# Patient Record
Sex: Female | Born: 1940 | Race: White | Hispanic: No | Marital: Married | Smoking: Current every day smoker
Health system: Southern US, Community
[De-identification: ages and names within clinical notes are randomized; demographics above are authoritative.]

## PROBLEM LIST (undated history)

## (undated) DIAGNOSIS — J45909 Unspecified asthma, uncomplicated: Secondary | ICD-10-CM

## (undated) DIAGNOSIS — J309 Allergic rhinitis, unspecified: Secondary | ICD-10-CM

## (undated) HISTORY — PX: CHOLECYSTECTOMY: SHX55

## (undated) HISTORY — DX: Unspecified asthma, uncomplicated: J45.909

## (undated) HISTORY — DX: Allergic rhinitis, unspecified: J30.9

---

## 1999-06-11 ENCOUNTER — Other Ambulatory Visit: Admission: RE | Admit: 1999-06-11 | Discharge: 1999-06-11 | Payer: Self-pay | Admitting: Internal Medicine

## 1999-10-22 ENCOUNTER — Encounter: Payer: Self-pay | Admitting: Internal Medicine

## 1999-10-22 ENCOUNTER — Encounter: Admission: RE | Admit: 1999-10-22 | Discharge: 1999-10-22 | Payer: Self-pay | Admitting: Internal Medicine

## 2000-11-03 ENCOUNTER — Encounter: Admission: RE | Admit: 2000-11-03 | Discharge: 2000-11-03 | Payer: Self-pay | Admitting: Internal Medicine

## 2000-11-03 ENCOUNTER — Encounter: Payer: Self-pay | Admitting: Internal Medicine

## 2001-05-18 ENCOUNTER — Encounter: Admission: RE | Admit: 2001-05-18 | Discharge: 2001-05-18 | Payer: Self-pay | Admitting: Urology

## 2001-05-18 ENCOUNTER — Encounter: Payer: Self-pay | Admitting: Urology

## 2003-01-24 ENCOUNTER — Encounter: Admission: RE | Admit: 2003-01-24 | Discharge: 2003-01-24 | Payer: Self-pay | Admitting: Internal Medicine

## 2003-01-24 ENCOUNTER — Encounter: Payer: Self-pay | Admitting: Internal Medicine

## 2003-05-10 ENCOUNTER — Ambulatory Visit (HOSPITAL_BASED_OUTPATIENT_CLINIC_OR_DEPARTMENT_OTHER): Admission: RE | Admit: 2003-05-10 | Discharge: 2003-05-10 | Payer: Self-pay | Admitting: Orthopedic Surgery

## 2004-12-31 ENCOUNTER — Encounter: Admission: RE | Admit: 2004-12-31 | Discharge: 2004-12-31 | Payer: Self-pay | Admitting: Internal Medicine

## 2006-04-28 ENCOUNTER — Encounter: Admission: RE | Admit: 2006-04-28 | Discharge: 2006-04-28 | Payer: Self-pay | Admitting: Internal Medicine

## 2008-03-05 ENCOUNTER — Encounter: Admission: RE | Admit: 2008-03-05 | Discharge: 2008-03-05 | Payer: Self-pay | Admitting: Internal Medicine

## 2009-06-19 ENCOUNTER — Encounter: Admission: RE | Admit: 2009-06-19 | Discharge: 2009-06-19 | Payer: Self-pay | Admitting: Family Medicine

## 2009-07-10 ENCOUNTER — Encounter: Admission: RE | Admit: 2009-07-10 | Discharge: 2009-07-10 | Payer: Self-pay | Admitting: Otolaryngology

## 2010-07-14 ENCOUNTER — Encounter: Admission: RE | Admit: 2010-07-14 | Discharge: 2010-07-14 | Payer: Self-pay | Admitting: Internal Medicine

## 2010-07-25 ENCOUNTER — Encounter: Admission: RE | Admit: 2010-07-25 | Discharge: 2010-07-25 | Payer: Self-pay | Admitting: Internal Medicine

## 2011-05-08 NOTE — Op Note (Signed)
NAME:  Amber Holt, REMILLARD                        ACCOUNT NO.:  [id number]   MEDICAL RECORD NO.:  [id number]                   PATIENT TYPE:  AMB   LOCATION:  [facility]SC                                  FACILITY:  [facility]   PHYSICIAN:  Katy Fitch. Naaman Plummer., M.D.          DATE OF BIRTH:  Nov 15, 1941   DATE OF PROCEDURE:  05/10/2003  DATE OF DISCHARGE:                                 OPERATIVE REPORT   PREOPERATIVE DIAGNOSIS:  1. 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  2. Left thumb carpometacarpal joint arthrosis.   POSTOPERATIVE DIAGNOSIS:  1. 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  2. Left thumb carpometacarpal joint arthrosis.   OPERATION PERFORMED:  1. Release of left thumb A-1 pulley.  2. Injection of left thumb carpometacarpal joint with Depo-Medrol and     lidocaine.   SURGEON:  Katy Fitch. Sypher, M.D.   ASSISTANT:  Jonni Sanger, P.A.   ANESTHESIA:  0.25% Marcaine and 2% lidocaine field block overlying the A-1  pulley, left thumb supplemented by IV sedation.   SUPERVISING ANESTHESIOLOGIST:  Janetta Hora. Gelene Mink, M.D.   INDICATIONS FOR PROCEDURE:  The patient is a 70 year old woman who is self-  referred for evaluation of a painful left thumb.  She had chronic stenosing  tenosynovitis of her flexor pollicis longus with locking at the A-1 pulley.  She observed this at home for more than six months in that she had  previously experienced carpal tunnel syndrome and trigger finger on the  right.   She presented for evaluation and management requesting released of the A-1  pulley.  In addition she was noted to have pain at the base of her left  thumb consistent with carpometacarpal arthrosis.  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 After informed consent she requested injection of  the [facility] joint with Depo-Medrol and lidocaine in an effort to relieve her  joint inflammation and discomfort.   DESCRIPTION OF PROCEDURE:  Willard Madrigal was brought to the operating room  and placed in supine position on the operating table.  Following sedation,  the left arm was prepped with Betadine soap and solution and sterilely  draped.  0.25% Marcaine and 2% lidocaine were infiltrated in the metacarpal  head level and into the flexor sheath to obtain a field block anticipating  the A-1 pulley release.  When anesthesia was satisfactory, the arm was  exsanguinated with an Esmarch bandage.  An arterial tourniquet was inflated  to 220 mmHg.  The procedure commenced with a short transverse incision  directly over the palpably thickened A-1 pulley.  Subcutaneous tissues were  carefully divided taking care to gently retract the radial proper digital  nerve.  The A-1 pulley was isolated and split with scalpel and scissors.  The flexor pollicis longus was delivered and found to have areas of minor  pressure necrosis.  These were debrided with a rongeur.   Attention  was then directed to the [facility] joint.  A syringe with 1mL of 1%  plain lidocaine and 0.27mL of 40mg /mL Depo-Medrol was used to inject  Depo-  Medrol and lidocaine into the capsule of the [facility] joint.  A technically  satisfactory injection was achieved.   The wound was then dressed with Xeroflo, sterile gauze and a soft dressing.  There were no apparent complications.                                                Katy Fitch Naaman Plummer., M.D.    RVS/MEDQ  D:  05/10/2003  T:  05/11/2003  Job:  [id number]

## 2011-08-17 ENCOUNTER — Other Ambulatory Visit: Payer: Self-pay | Admitting: Physician Assistant

## 2011-08-17 DIAGNOSIS — Z1231 Encounter for screening mammogram for malignant neoplasm of breast: Secondary | ICD-10-CM

## 2011-09-25 ENCOUNTER — Other Ambulatory Visit: Payer: Self-pay | Admitting: Orthopedic Surgery

## 2011-09-25 ENCOUNTER — Ambulatory Visit
Admission: RE | Admit: 2011-09-25 | Discharge: 2011-09-25 | Disposition: A | Discharge status: Home / Self Care | Payer: Medicare Other | Source: Ambulatory Visit | Attending: Orthopedic Surgery | Admitting: Orthopedic Surgery

## 2011-09-25 DIAGNOSIS — R918 Other nonspecific abnormal finding of lung field: Secondary | ICD-10-CM

## 2011-09-30 ENCOUNTER — Ambulatory Visit
Admission: RE | Admit: 2011-09-30 | Discharge: 2011-09-30 | Disposition: A | Discharge status: Home / Self Care | Payer: Medicare Other | Source: Ambulatory Visit | Attending: Physician Assistant | Admitting: Physician Assistant

## 2011-09-30 DIAGNOSIS — Z1231 Encounter for screening mammogram for malignant neoplasm of breast: Secondary | ICD-10-CM

## 2012-10-10 ENCOUNTER — Other Ambulatory Visit: Payer: Self-pay | Admitting: Physician Assistant

## 2012-10-10 DIAGNOSIS — Z1231 Encounter for screening mammogram for malignant neoplasm of breast: Secondary | ICD-10-CM

## 2012-11-07 ENCOUNTER — Ambulatory Visit: Payer: Medicare Other

## 2012-11-14 ENCOUNTER — Ambulatory Visit
Admission: RE | Admit: 2012-11-14 | Discharge: 2012-11-14 | Disposition: A | Discharge status: Home / Self Care | Payer: Medicare Other | Source: Ambulatory Visit | Attending: Physician Assistant | Admitting: Physician Assistant

## 2012-11-14 DIAGNOSIS — Z1231 Encounter for screening mammogram for malignant neoplasm of breast: Secondary | ICD-10-CM

## 2013-01-09 ENCOUNTER — Encounter: Payer: Self-pay | Admitting: Emergency Medicine

## 2013-01-09 ENCOUNTER — Ambulatory Visit (INDEPENDENT_AMBULATORY_CARE_PROVIDER_SITE_OTHER): Payer: Medicare Other | Admitting: Emergency Medicine

## 2013-01-09 ENCOUNTER — Other Ambulatory Visit: Payer: Self-pay | Admitting: Emergency Medicine

## 2013-01-09 ENCOUNTER — Other Ambulatory Visit (INDEPENDENT_AMBULATORY_CARE_PROVIDER_SITE_OTHER): Payer: Medicare Other

## 2013-01-09 VITALS — BP 110/80 | HR 98 | Temp 98.0°F | Ht 60.0 in | Wt 136.0 lb

## 2013-01-09 DIAGNOSIS — J449 Chronic obstructive pulmonary disease, unspecified: Secondary | ICD-10-CM

## 2013-01-09 DIAGNOSIS — Z720 Tobacco use: Secondary | ICD-10-CM

## 2013-01-09 DIAGNOSIS — R918 Other nonspecific abnormal finding of lung field: Secondary | ICD-10-CM

## 2013-01-09 DIAGNOSIS — R911 Solitary pulmonary nodule: Secondary | ICD-10-CM

## 2013-01-09 DIAGNOSIS — F17200 Nicotine dependence, unspecified, uncomplicated: Secondary | ICD-10-CM

## 2013-01-09 DIAGNOSIS — Z87891 Personal history of nicotine dependence: Secondary | ICD-10-CM | POA: Insufficient documentation

## 2013-01-09 NOTE — Assessment & Plan Note (Signed)
-   repeat Ct scan with contrast and compare with 2012

## 2013-01-09 NOTE — Assessment & Plan Note (Signed)
Severity unclear, but has had 2 exacerbations since 11/13.  - discussed tobacco cessation - full PFT's - rov next available

## 2013-01-09 NOTE — Patient Instructions (Addendum)
We will perform full pulmonary function testing at your next visit We will repeat your CT scan of the chest  Work on cutting your cigarettes down to 1/2 pack of cigarettes a day by our next visit Follow with Dr Delton Coombes next available with full PFT's

## 2013-01-09 NOTE — Assessment & Plan Note (Signed)
-   will cut down to 1/2 pk by next time, then try to set a quit date

## 2013-01-09 NOTE — Progress Notes (Signed)
  Subjective:    Patient ID: Amber Holt, female    DOB: [birth year], 72 y.o.   MRN: [id number]  HPI 72 yo smoker (35 pk-yrs), frequent bronchitis / PNA, allergies. Continues to smoke 1 pk a day. Beginning in 11/13 she has had bouts of bronchitis and apparent COPD exacerbation. When she isn't exacerbated, she does do some wheezing. She does cough. Lately productive, but not at baseline. She has nasal gtt, contributes to cough, uses fluticasone spray. She has had CXR's during the flares that show some RUL scar, stable on serial films, last done 12/13. She is finishing up a course of omnicef.    Review of Systems  Constitutional: Negative for fever and unexpected weight change.  HENT: Positive for ear pain and sneezing. Negative for nosebleeds, congestion, sore throat, rhinorrhea, trouble swallowing, dental problem, postnasal drip and sinus pressure.   Eyes: Negative for redness and itching.  Respiratory: Positive for cough (Productive). Negative for chest tightness, shortness of breath and wheezing.   Cardiovascular: Negative for palpitations and leg swelling.  Gastrointestinal: Negative for nausea and vomiting.  Genitourinary: Negative for dysuria.  Musculoskeletal: Negative for joint swelling.  Skin: Negative for rash.  Neurological: Negative for headaches.  Hematological: Does not bruise/bleed easily.  Psychiatric/Behavioral: Negative for dysphoric mood. The patient is not nervous/anxious.     Past Medical History  Diagnosis Date  . Asthma   . Allergic rhinitis      Family History  Problem Relation Age of Onset  . Heart disease Mother   . Emphysema Father      History   Social History  . Marital Status: Married    Spouse Name: N/A    Number of Children: N/A  . Years of Education: N/A   Occupational History  . Not on file.   Social History Main Topics  . Smoking status: Current Every Day Smoker -- 1.0 packs/day for 30 years    Types: Cigarettes  . Smokeless  tobacco: Not on file  . Alcohol Use: No  . Drug Use: No  . Sexually Active: Not on file   Other Topics Concern  . Not on file   Social History Narrative  . No narrative on file     No Known Allergies   No outpatient prescriptions prior to visit.    Last reviewed on 01/09/2013  3:41 PM by Caryl Ada, CMA       Objective:   Physical Exam Filed Vitals:   01/09/13 1543  BP: 110/80  Pulse: 98  Temp: 98 F (36.7 C)  Gen: Pleasant, well-nourished, in no distress,  normal affect  ENT: No lesions,  mouth clear,  oropharynx clear, no postnasal drip  Neck: No JVD, no TMG, no carotid bruits  Lungs: No use of accessory muscles, no dullness to percussion, clear without rales or rhonchi  Cardiovascular: RRR, heart sounds normal, no murmur or gallops, no peripheral edema  Musculoskeletal: No deformities, no cyanosis or clubbing  Neuro: alert, non focal  Skin: Warm, no lesions or rashes      Assessment & Plan:  COPD (chronic obstructive pulmonary disease) Severity unclear, but has had 2 exacerbations since 11/13.  - discussed tobacco cessation - full PFT's - rov next available  Tobacco abuse - will cut down to 1/2 pk by next time, then try to set a quit date  Pulmonary nodules - repeat Ct scan with contrast and compare with 2012

## 2013-01-10 LAB — BASIC METABOLIC PANEL
BUN: 12 mg/dL (ref 6–23)
Calcium: 9.3 mg/dL (ref 8.4–10.5)
Creatinine, Ser: 0.6 mg/dL (ref 0.4–1.2)
Sodium: 140 mEq/L (ref 135–145)

## 2013-01-11 ENCOUNTER — Ambulatory Visit (INDEPENDENT_AMBULATORY_CARE_PROVIDER_SITE_OTHER)
Admission: RE | Admit: 2013-01-11 | Discharge: 2013-01-11 | Disposition: A | Discharge status: Home / Self Care | Payer: Medicare Other | Source: Ambulatory Visit | Attending: Emergency Medicine | Admitting: Emergency Medicine

## 2013-01-11 DIAGNOSIS — R918 Other nonspecific abnormal finding of lung field: Secondary | ICD-10-CM

## 2013-01-11 MED ORDER — IOHEXOL 300 MG/ML  SOLN
80.0000 mL | Freq: Once | INTRAMUSCULAR | Status: AC | PRN
  Administered 2013-01-11: 80 mL via INTRAVENOUS

## 2013-01-11 NOTE — Progress Notes (Signed)
Quick Note:  Called and spoke with patient, informed her of results/recs as listed below per RB. Verbalized understanding and nothing further needed at this time. ______

## 2013-02-01 ENCOUNTER — Ambulatory Visit: Payer: Medicare Other | Admitting: Emergency Medicine

## 2013-03-08 ENCOUNTER — Ambulatory Visit (INDEPENDENT_AMBULATORY_CARE_PROVIDER_SITE_OTHER): Payer: Medicare Other | Admitting: Emergency Medicine

## 2013-03-08 ENCOUNTER — Encounter: Payer: Self-pay | Admitting: Emergency Medicine

## 2013-03-08 VITALS — BP 120/66 | HR 98 | Temp 96.7°F | Ht 60.0 in | Wt 137.0 lb

## 2013-03-08 DIAGNOSIS — J449 Chronic obstructive pulmonary disease, unspecified: Secondary | ICD-10-CM

## 2013-03-08 LAB — PULMONARY FUNCTION TEST

## 2013-03-08 MED ORDER — TIOTROPIUM BROMIDE MONOHYDRATE 18 MCG IN CAPS: 18.0000 ug | ORAL_CAPSULE | Freq: Every day | RESPIRATORY_TRACT | Status: DC

## 2013-03-08 NOTE — Assessment & Plan Note (Signed)
-   will work w her on smoking cessation - trial spiriva - saba prn - rov 6 weeks

## 2013-03-08 NOTE — Progress Notes (Signed)
PFT done today. 

## 2013-03-08 NOTE — Patient Instructions (Addendum)
Please call the cigarette quit line.  We can order wellbutrin or nicotine patches depending on your plans to quit and the timing. Please call our office when you are ready to set a quit date.  We will start spiriva daily. Keep track of whether your breathing changes on this medication Continue to use albuterol 2 puffs when you need it.  Follow with Dr Delton Coombes in 4-6 weeks or sooner if you have any problems

## 2013-03-08 NOTE — Progress Notes (Signed)
  Subjective:    Patient ID: Amber Holt, female    DOB: 08-22-1941, 72 y.o.   MRN: [id number]  HPI 72 yo smoker (35 pk-yrs), frequent bronchitis / PNA, allergies. Continues to smoke 1 pk a day. Beginning in 11/13 she has had bouts of bronchitis and apparent COPD exacerbation. When she isn't exacerbated, she does do some wheezing. She does cough. Lately productive, but not at baseline. She has nasal gtt, contributes to cough, uses fluticasone spray. She has had CXR's during the flares that show some RUL scar, stable on serial films, last done 12/13. She is finishing up a course of omnicef.   ROV 03/08/13 -- hx tobacco, presumed COPD and recent AE. Returns for follow up. Her cough is better, but still happens. Phlegm is light in color.  Still has some wheeze. Her PND is better also on fluticasone. Using albuterol 2-3x a day. Still smoking less than 1pk/day.    Review of Systems  Constitutional: Negative for fever and unexpected weight change.  HENT: Negative for ear pain, nosebleeds, congestion, sore throat, rhinorrhea, sneezing, trouble swallowing, dental problem, postnasal drip and sinus pressure.   Eyes: Negative for redness and itching.  Respiratory: Negative for cough (Productive), chest tightness, shortness of breath and wheezing.   Cardiovascular: Negative for palpitations and leg swelling.  Gastrointestinal: Negative for nausea and vomiting.  Genitourinary: Negative for dysuria.  Musculoskeletal: Negative for joint swelling.  Skin: Negative for rash.  Neurological: Negative for headaches.  Hematological: Does not bruise/bleed easily.  Psychiatric/Behavioral: Negative for dysphoric mood. The patient is not nervous/anxious.        Objective:   Physical Exam Filed Vitals:   03/08/13 1323  BP: 120/66  Pulse: 98  Temp: 96.7 F (35.9 C)  Gen: Pleasant, well-nourished, in no distress,  normal affect  ENT: No lesions,  mouth clear,  oropharynx clear, no postnasal drip  Neck:  No JVD, no TMG, no carotid bruits  Lungs: No use of accessory muscles, no dullness to percussion, clear without rales or rhonchi  Cardiovascular: RRR, heart sounds normal, no murmur or gallops, no peripheral edema  Musculoskeletal: No deformities, no cyanosis or clubbing  Neuro: alert, non focal  Skin: Warm, no lesions or rashes      Assessment & Plan:  COPD (chronic obstructive pulmonary disease) - will work w her on smoking cessation - trial spiriva - saba prn - rov 6 weeks

## 2013-04-05 ENCOUNTER — Ambulatory Visit (INDEPENDENT_AMBULATORY_CARE_PROVIDER_SITE_OTHER): Payer: Medicare Other | Admitting: Emergency Medicine

## 2013-04-05 ENCOUNTER — Encounter: Payer: Self-pay | Admitting: Emergency Medicine

## 2013-04-05 VITALS — BP 122/80 | HR 82 | Temp 97.5°F | Ht 60.0 in | Wt 135.8 lb

## 2013-04-05 DIAGNOSIS — J209 Acute bronchitis, unspecified: Secondary | ICD-10-CM

## 2013-04-05 DIAGNOSIS — F17200 Nicotine dependence, unspecified, uncomplicated: Secondary | ICD-10-CM

## 2013-04-05 DIAGNOSIS — Z720 Tobacco use: Secondary | ICD-10-CM

## 2013-04-05 DIAGNOSIS — J449 Chronic obstructive pulmonary disease, unspecified: Secondary | ICD-10-CM

## 2013-04-05 MED ORDER — DOXYCYCLINE HYCLATE 100 MG PO CAPS: 100.0000 mg | ORAL_CAPSULE | Freq: Two times a day (BID) | ORAL | Status: DC

## 2013-04-05 NOTE — Progress Notes (Signed)
Subjective:    Patient ID: Amber Holt, female    DOB: January 04, 1941, 72 y.o.   MRN: [id number]  HPI 72 yo smoker (35 pk-yrs), frequent bronchitis / PNA, allergies. Continues to smoke 1 pk a day. Beginning in 11/13 she has had bouts of bronchitis and apparent COPD exacerbation. When she isn't exacerbated, she does do some wheezing. She does cough. Lately productive, but not at baseline. She has nasal gtt, contributes to cough, uses fluticasone spray. She has had CXR's during the flares that show some RUL scar, stable on serial films, last done 12/13. She is finishing up a course of omnicef.   ROV 03/08/13 -- hx tobacco, presumed COPD and recent AE. Returns for follow up. Her cough is better, but still happens. Phlegm is light in color.  Still has some wheeze. Her PND is better also on fluticasone. Using albuterol 2-3x a day. Still smoking less than 1pk/day.   ROV 04/05/13 -- COPD, mild AFL. Returns after starting Spiriva last month. She hasn't had a cigarette for over a week. For last 3 days had had more nasal congestion, more yellow chest congestion. She likes the spiriva, feels better, more energy. Rare albuterol use. No fevers, but "feels sick".   PULMONARY FUNCTON TEST 03/08/2013  FVC 2.35  FEV1 1.59  FEV1/FVC 67.7  FVC  % Predicted 98  FEV % Predicted 95  FeF 25-75 .84  FeF 25-75 % Predicted 2.08   CT CHEST WITH CONTRAST 01/11/13 --  Technique: Multidetector CT imaging of the chest was performed  following the standard protocol during bolus administration of  intravenous contrast.  Contrast: 80mL OMNIPAQUE IOHEXOL 300 MG/ML SOLN  Comparison: 09/25/2011.  Findings: Mediastinal and hilar lymph nodes are not enlarged by CT  size criteria. No axillary adenopathy. Coronary artery  calcification. Heart size normal. No pericardial effusion.  Centrilobular emphysema. Scattered bilateral pulmonary nodules  measure 5 mm or less in size, stable. No pleural fluid. Airway is  unremarkable.   Incidental imaging of the upper abdomen shows no acute findings.  No worrisome lytic or sclerotic lesions.  IMPRESSION:  1. Emphysema without superimposed acute finding.  2. Scattered small pulmonary nodules, stable from 09/25/2011.  Given risk factors for bronchogenic carcinoma, additional follow-up  in 12 months is recommended.   Review of Systems  Constitutional: Negative for fever and unexpected weight change.  HENT: Negative for ear pain, nosebleeds, congestion, sore throat, rhinorrhea, sneezing, trouble swallowing, dental problem, postnasal drip and sinus pressure.   Eyes: Negative for redness and itching.  Respiratory: Negative for cough (Productive), chest tightness, shortness of breath and wheezing.   Cardiovascular: Negative for palpitations and leg swelling.  Gastrointestinal: Negative for nausea and vomiting.  Genitourinary: Negative for dysuria.  Musculoskeletal: Negative for joint swelling.  Skin: Negative for rash.  Neurological: Negative for headaches.  Hematological: Does not bruise/bleed easily.  Psychiatric/Behavioral: Negative for dysphoric mood. The patient is not nervous/anxious.        Objective:   Physical Exam Filed Vitals:   04/05/13 0924  BP: 122/80  Pulse: 82  Temp: 97.5 F (36.4 C)  TempSrc: Oral  Height: 5' (1.524 m)  Weight: 135 lb 12.8 oz (61.598 kg)  SpO2: 97%   Gen: Pleasant, well-nourished, in no distress,  normal affect  ENT: No lesions,  mouth clear,  oropharynx clear, no postnasal drip  Neck: No JVD, no TMG, no carotid bruits  Lungs: No use of accessory muscles, no dullness to percussion, clear without rales or rhonchi  Cardiovascular:  RRR, heart sounds normal, no murmur or gallops, no peripheral edema  Musculoskeletal: No deformities, no cyanosis or clubbing  Neuro: alert, non focal  Skin: Warm, no lesions or rashes      Assessment & Plan:  Acute bronchitis Will treat with doxycycline x 7 days, no wheeze so defer  steroidss  COPD (chronic obstructive pulmonary disease) Continue spiriva + SABA prn  Tobacco abuse Congratulated cessation. Discussed plan for tapering down nicotine patches, currently on 21mg

## 2013-04-05 NOTE — Assessment & Plan Note (Signed)
Congratulated cessation. Discussed plan for tapering down nicotine patches, currently on 21mg 

## 2013-04-05 NOTE — Assessment & Plan Note (Signed)
Continue spiriva + SABA prn 

## 2013-04-05 NOTE — Assessment & Plan Note (Signed)
Will treat with doxycycline x 7 days, no wheeze so defer steroidss

## 2013-04-05 NOTE — Patient Instructions (Addendum)
Please continue your Spiriva daily  Have albuterol available to use as needed Take doxycycline 100mg  twice a day until gone CONGRATULATIONS ON QUITTING SMOKING!! Call our office if we can help you stay off! We will repeat a CT scan of your chest in 12/2013.  Follow with Dr Delton Coombes in 3 months or sooner if you have any problems.

## 2013-07-12 ENCOUNTER — Ambulatory Visit (INDEPENDENT_AMBULATORY_CARE_PROVIDER_SITE_OTHER): Payer: Medicare Other | Admitting: Emergency Medicine

## 2013-07-12 ENCOUNTER — Encounter: Payer: Self-pay | Admitting: Emergency Medicine

## 2013-07-12 VITALS — BP 110/80 | HR 77 | Temp 97.9°F | Ht 60.0 in | Wt 139.6 lb

## 2013-07-12 DIAGNOSIS — J449 Chronic obstructive pulmonary disease, unspecified: Secondary | ICD-10-CM

## 2013-07-12 DIAGNOSIS — Z720 Tobacco use: Secondary | ICD-10-CM

## 2013-07-12 DIAGNOSIS — R918 Other nonspecific abnormal finding of lung field: Secondary | ICD-10-CM

## 2013-07-12 DIAGNOSIS — F17200 Nicotine dependence, unspecified, uncomplicated: Secondary | ICD-10-CM

## 2013-07-12 NOTE — Assessment & Plan Note (Signed)
Has not restarted

## 2013-07-12 NOTE — Patient Instructions (Addendum)
Please continue your spiriva daily for now. We will consider taking a break from this at our next visit Use albuterol if needed We will repeat your CT chest in October  Follow up with Dr Delton Coombes in October after your scan to review.

## 2013-07-12 NOTE — Assessment & Plan Note (Signed)
Will continue spiriva for now, consider holding in the future to see if she misses

## 2013-07-12 NOTE — Progress Notes (Signed)
Subjective:    Patient ID: Amber Holt, female    DOB: Feb 04, 1941, 72 y.o.   MRN: [id number]  HPI 72 yo smoker (35 pk-yrs), frequent bronchitis / PNA, allergies. Continues to smoke 1 pk a day. Beginning in 11/13 she has had bouts of bronchitis and apparent COPD exacerbation. When she isn't exacerbated, she does do some wheezing. She does cough. Lately productive, but not at baseline. She has nasal gtt, contributes to cough, uses fluticasone spray. She has had CXR's during the flares that show some RUL scar, stable on serial films, last done 12/13. She is finishing up a course of omnicef.   ROV 03/08/13 -- hx tobacco, presumed COPD and recent AE. Returns for follow up. Her cough is better, but still happens. Phlegm is light in color.  Still has some wheeze. Her PND is better also on fluticasone. Using albuterol 2-3x a day. Still smoking less than 1pk/day.   ROV 04/05/13 -- COPD, mild AFL. Returns after starting Spiriva last month. She hasn't had a cigarette for over a week. For last 3 days had had more nasal congestion, more yellow chest congestion. She likes the spiriva, feels better, more energy. Rare albuterol use. No fevers, but "feels sick".   ROV 07/12/13 -- f/u for COPD.  She has not restarted smoking!  She was treated recently for an acute sinusitis with levofloxacin - better. Remains active although some fatigue. Works 3x a week, does her yard work. Rare albuterol use, happens when she is outside.   PULMONARY FUNCTON TEST 03/08/2013  FVC 2.35  FEV1 1.59  FEV1/FVC 67.7  FVC  % Predicted 98  FEV % Predicted 95  FeF 25-75 .84  FeF 25-75 % Predicted 2.08   CT CHEST WITH CONTRAST 01/11/13 --  Technique: Multidetector CT imaging of the chest was performed  following the standard protocol during bolus administration of  intravenous contrast.  Contrast: 80mL OMNIPAQUE IOHEXOL 300 MG/ML SOLN  Comparison: 09/25/2011.  Findings: Mediastinal and hilar lymph nodes are not enlarged by CT   size criteria. No axillary adenopathy. Coronary artery  calcification. Heart size normal. No pericardial effusion.  Centrilobular emphysema. Scattered bilateral pulmonary nodules  measure 5 mm or less in size, stable. No pleural fluid. Airway is  unremarkable.  Incidental imaging of the upper abdomen shows no acute findings.  No worrisome lytic or sclerotic lesions.  IMPRESSION:  1. Emphysema without superimposed acute finding.  2. Scattered small pulmonary nodules, stable from 09/25/2011.  Given risk factors for bronchogenic carcinoma, additional follow-up  in 12 months is recommended.   Review of Systems  Constitutional: Negative for fever and unexpected weight change.  HENT: Negative for ear pain, nosebleeds, congestion, sore throat, rhinorrhea, sneezing, trouble swallowing, dental problem, postnasal drip and sinus pressure.   Eyes: Negative for redness and itching.  Respiratory: Negative for cough (Productive), chest tightness, shortness of breath and wheezing.   Cardiovascular: Negative for palpitations and leg swelling.  Gastrointestinal: Negative for nausea and vomiting.  Genitourinary: Negative for dysuria.  Musculoskeletal: Negative for joint swelling.  Skin: Negative for rash.  Neurological: Negative for headaches.  Hematological: Does not bruise/bleed easily.  Psychiatric/Behavioral: Negative for dysphoric mood. The patient is not nervous/anxious.        Objective:   Physical Exam Filed Vitals:   07/12/13 0858  BP: 110/80  Pulse: 77  Temp: 97.9 F (36.6 C)  TempSrc: Oral  Height: 5' (1.524 m)  Weight: 139 lb 9.6 oz (63.322 kg)  SpO2: 96%   Gen:  Pleasant, well-nourished, in no distress,  normal affect  ENT: No lesions,  mouth clear,  oropharynx clear, no postnasal drip  Neck: No JVD, no TMG, no carotid bruits  Lungs: No use of accessory muscles, no dullness to percussion, clear without rales or rhonchi  Cardiovascular: RRR, heart sounds normal, no murmur  or gallops, no peripheral edema  Musculoskeletal: No deformities, no cyanosis or clubbing  Neuro: alert, non focal  Skin: Warm, no lesions or rashes      Assessment & Plan:  COPD (chronic obstructive pulmonary disease) Will continue spiriva for now, consider holding in the future to see if she misses   Pulmonary nodules Repeat Ct in October   Tobacco abuse Has not restarted

## 2013-07-12 NOTE — Assessment & Plan Note (Signed)
Repeat Ct in October

## 2013-08-02 ENCOUNTER — Encounter: Payer: Self-pay | Admitting: Internal Medicine

## 2013-08-02 ENCOUNTER — Ambulatory Visit (INDEPENDENT_AMBULATORY_CARE_PROVIDER_SITE_OTHER): Payer: Medicare Other | Admitting: Internal Medicine

## 2013-08-02 VITALS — BP 92/60 | HR 76 | Ht 60.0 in | Wt 141.4 lb

## 2013-08-02 DIAGNOSIS — J449 Chronic obstructive pulmonary disease, unspecified: Secondary | ICD-10-CM

## 2013-08-02 DIAGNOSIS — R05 Cough: Secondary | ICD-10-CM

## 2013-08-02 MED ORDER — AMOXICILLIN-POT CLAVULANATE 875-125 MG PO TABS: 1.0000 | ORAL_TABLET | Freq: Two times a day (BID) | ORAL | Status: DC

## 2013-08-02 NOTE — Patient Instructions (Addendum)
Script for augmentin sent  Ok to stop the Spiriva 2-3 days to see if that helps the tingling sensation   Keep appointment back with Dr Delton Coombes

## 2013-08-02 NOTE — Progress Notes (Signed)
Subjective:    Patient ID: Amber Holt, female    DOB: Oct 19, 1941, 72 y.o.   MRN: [id number]  HPI 72 yo smoker (35 pk-yrs), frequent bronchitis / PNA, allergies. Continues to smoke 1 pk a day. Beginning in 11/13 she has had bouts of bronchitis and apparent COPD exacerbation. When she isn't exacerbated, she does do some wheezing. She does cough. Lately productive, but not at baseline. She has nasal gtt, contributes to cough, uses fluticasone spray. She has had CXR's during the flares that show some RUL scar, stable on serial films, last done 12/13. She is finishing up a course of omnicef.   ROV 03/08/13 -- hx tobacco, presumed COPD and recent AE. Returns for follow up. Her cough is better, but still happens. Phlegm is light in color.  Still has some wheeze. Her PND is better also on fluticasone. Using albuterol 2-3x a day. Still smoking less than 1pk/day.   ROV 04/05/13 -- COPD, mild AFL. Returns after starting Spiriva last month. She hasn't had a cigarette for over a week. For last 3 days had had more nasal congestion, more yellow chest congestion. She likes the spiriva, feels better, more energy. Rare albuterol use. No fevers, but "feels sick".   ROV 07/12/13 -- f/u for COPD.  She has not restarted smoking!  She was treated recently for an acute sinusitis with levofloxacin - better. Remains active although some fatigue. Works 3x a week, does her yard work. Rare albuterol use, happens when she is outside.   PULMONARY FUNCTON TEST 03/08/2013  FVC 2.35  FEV1 1.59  FEV1/FVC 67.7  FVC  % Predicted 98  FEV % Predicted 95  FeF 25-75 .84  FeF 25-75 % Predicted 2.08   CT CHEST WITH CONTRAST 01/11/13 --  Technique: Multidetector CT imaging of the chest was performed  following the standard protocol during bolus administration of  intravenous contrast.  Contrast: 80mL OMNIPAQUE IOHEXOL 300 MG/ML SOLN  Comparison: 09/25/2011.  Findings: Mediastinal and hilar lymph nodes are not enlarged by CT   size criteria. No axillary adenopathy. Coronary artery  calcification. Heart size normal. No pericardial effusion.  Centrilobular emphysema. Scattered bilateral pulmonary nodules  measure 5 mm or less in size, stable. No pleural fluid. Airway is  unremarkable.  Incidental imaging of the upper abdomen shows no acute findings.  No worrisome lytic or sclerotic lesions.  IMPRESSION:  1. Emphysema without superimposed acute finding.  2. Scattered small pulmonary nodules, stable from 09/25/2011.  Given risk factors for bronchogenic carcinoma, additional follow-up  in 12 months is recommended.  08/02/13- 6 yo smoker (35 pk-yrs), followed by Dr Delton Coombes for frequent bronchitis / PNA, allergies. Acute visit- FOLLOW FOR: pt reports feeling like having a side effect from spiriva, feels skin is "crawling and tingling", having chest tightness, bringing up more mucus w bits of yellow, some increased SOB x 1 month. Tingling sensation is increased by stress Denies itching or rash. Pain mid thoracic spine radiates around bilateral ribs. Does not hurt to cough.  Review of Systems  Constitutional: Negative for fever and unexpected weight change.  HENT: Negative for ear pain, nosebleeds, congestion, sore throat, rhinorrhea, sneezing, trouble swallowing, dental problem, postnasal drip and sinus pressure.   Eyes: Negative for redness and itching.  Respiratory: Negative for cough (Productive), chest tightness, shortness of breath and wheezing.   Cardiovascular: Negative for palpitations and leg swelling.  Gastrointestinal: Negative for nausea and vomiting.  Genitourinary: Negative for dysuria.  Musculoskeletal: Negative for joint swelling.  Skin: Negative  for rash.  Neurological: Negative for headaches.  Hematological: Does not bruise/bleed easily.  Psychiatric/Behavioral: Negative for dysphoric mood. The patient is not nervous/anxious.       Objective:   Physical Exam Filed Vitals:   07/12/13 0858  BP:  110/80  Pulse: 77  Temp: 97.9 F (36.6 C)  TempSrc: Oral  Height: 5' (1.524 m)  Weight: 139 lb 9.6 oz (63.322 kg)  SpO2: 96%   OBJ- Physical Exam General- Alert, Oriented, Affect-+anxious, Distress- none acute Skin- rash-none, lesions- none, excoriation- none Lymphadenopathy- none Head- atraumatic            Eyes- Gross vision intact, PERRLA, conjunctivae and secretions clear            Ears- Hearing, canals-normal            Nose- Clear, no-Septal dev, mucus, polyps, erosion, perforation             Throat- Mallampati II , mucosa clear , drainage- none, tonsils- atrophic Neck- flexible , trachea midline, no stridor , thyroid nl, carotid no bruit Chest - symmetrical excursion , unlabored           Heart/CV- RRR , no murmur , no gallop  , no rub, nl s1 s2                           - JVD- none , edema- none, stasis changes- none, varices- none           Lung- clear to P&A, wheeze- none, cough- none , dullness-none, rub- none           Chest wall-  Abd-  Br/ Gen/ Rectal- Not done, not indicated Extrem- cyanosis- none, clubbing, none, atrophy- none, strength- nl Neuro- grossly intact to observation        Assessment & Plan:  COPD (chronic obstructive pulmonary disease) Will continue spiriva for now, consider holding in the future to see if she misses   Pulmonary nodules Repeat Ct in October   Tobacco abuse Has not restarted

## 2013-08-03 ENCOUNTER — Telehealth: Payer: Self-pay | Admitting: Emergency Medicine

## 2013-08-03 NOTE — Telephone Encounter (Signed)
Per CY- stay off abx through the weekend, let stomach settle and check up with Dr. Delton Coombes next week  Spoke with Amber Holt-- Amber Holt aware of recs above and she has been scheduled to see Dr. Hezzie Bump 8/19 @ 130pm Nothing further needed at this time

## 2013-08-03 NOTE — Telephone Encounter (Signed)
Pt was seen by CDY yesterday for an acute visit with the following instructions:  Patient Instructions    Script for augmentin sent  Ok to stop the Spiriva 2-3 days to see if that helps the tingling sensation  Keep appointment back with Dr Delton Coombes   -----  Called, spoke with pt - States approx 1 hour after taking the first dose of augmentin yesterday, she had "horrible, horrible stomach cramps, vomiting, and diarrhea."  This lasted for 7 hours.  Pt took the abx with grapes and crackers.  She is requesting this be changed to something else.  Doesn't tolerate levaquin well.  As pt was seen yesterday by CDY, will route msg to him for further recs.  Pls advise.  Thank you.  [facility]  Allergies  Allergen Reactions  . Sulfa Antibiotics Other (See Comments)    Swelling in face and tongue

## 2013-08-08 ENCOUNTER — Ambulatory Visit (INDEPENDENT_AMBULATORY_CARE_PROVIDER_SITE_OTHER): Payer: Medicare Other | Admitting: Emergency Medicine

## 2013-08-08 ENCOUNTER — Encounter: Payer: Self-pay | Admitting: Emergency Medicine

## 2013-08-08 VITALS — BP 108/72 | HR 82 | Temp 97.8°F | Ht 60.0 in | Wt 141.2 lb

## 2013-08-08 DIAGNOSIS — R05 Cough: Secondary | ICD-10-CM

## 2013-08-08 DIAGNOSIS — F17200 Nicotine dependence, unspecified, uncomplicated: Secondary | ICD-10-CM

## 2013-08-08 DIAGNOSIS — Z720 Tobacco use: Secondary | ICD-10-CM

## 2013-08-08 DIAGNOSIS — J449 Chronic obstructive pulmonary disease, unspecified: Secondary | ICD-10-CM

## 2013-08-08 DIAGNOSIS — R918 Other nonspecific abnormal finding of lung field: Secondary | ICD-10-CM

## 2013-08-08 MED ORDER — OMEPRAZOLE 20 MG PO CPDR: 20.0000 mg | DELAYED_RELEASE_CAPSULE | Freq: Every day | ORAL | Status: DC

## 2013-08-08 NOTE — Patient Instructions (Addendum)
Continue your Spiriva, loratadine and fluticasone nasal spray Start omeprazole 20mg  daily Work on decreasing caffeine, late meals.   We will schedule your CT scan for October 2014 Follow with Dr Delton Coombes in October after the scan to review

## 2013-08-08 NOTE — Assessment & Plan Note (Signed)
Remains off 

## 2013-08-08 NOTE — Progress Notes (Signed)
Subjective:    Patient ID: Amber Holt, female    DOB: 11-05-1941, 72 y.o.   MRN: [id number]  HPI 72 yo smoker (35 pk-yrs), frequent bronchitis / PNA, allergies. Continues to smoke 1 pk a day. Beginning in 11/13 she has had bouts of bronchitis and apparent COPD exacerbation. When she isn't exacerbated, she does do some wheezing. She does cough. Lately productive, but not at baseline. She has nasal gtt, contributes to cough, uses fluticasone spray. She has had CXR's during the flares that show some RUL scar, stable on serial films, last done 12/13. She is finishing up a course of omnicef.   ROV 03/08/13 -- hx tobacco, presumed COPD and recent AE. Returns for follow up. Her cough is better, but still happens. Phlegm is light in color.  Still has some wheeze. Her PND is better also on fluticasone. Using albuterol 2-3x a day. Still smoking less than 1pk/day.   ROV 04/05/13 -- COPD, mild AFL. Returns after starting Spiriva last month. She hasn't had a cigarette for over a week. For last 3 days had had more nasal congestion, more yellow chest congestion. She likes the spiriva, feels better, more energy. Rare albuterol use. No fevers, but "feels sick".   ROV 07/12/13 -- f/u for COPD.  She has not restarted smoking!  She was treated recently for an acute sinusitis with levofloxacin - better. Remains active although some fatigue. Works 3x a week, does her yard work. Rare albuterol use, happens when she is outside.   PULMONARY FUNCTON TEST 03/08/2013  FVC 2.35  FEV1 1.59  FEV1/FVC 67.7  FVC  % Predicted 98  FEV % Predicted 95  FeF 25-75 .84  FeF 25-75 % Predicted 2.08   CT CHEST WITH CONTRAST 01/11/13 --  Technique: Multidetector CT imaging of the chest was performed  following the standard protocol during bolus administration of  intravenous contrast.  Contrast: 80mL OMNIPAQUE IOHEXOL 300 MG/ML SOLN  Comparison: 09/25/2011.  Findings: Mediastinal and hilar lymph nodes are not enlarged by CT   size criteria. No axillary adenopathy. Coronary artery  calcification. Heart size normal. No pericardial effusion.  Centrilobular emphysema. Scattered bilateral pulmonary nodules  measure 5 mm or less in size, stable. No pleural fluid. Airway is  unremarkable.  Incidental imaging of the upper abdomen shows no acute findings.  No worrisome lytic or sclerotic lesions.  IMPRESSION:  1. Emphysema without superimposed acute finding.  2. Scattered small pulmonary nodules, stable from 09/25/2011.  Given risk factors for bronchogenic carcinoma, additional follow-up  in 12 months is recommended.  08/02/13- 78 yo smoker (35 pk-yrs), followed by Dr Delton Coombes for frequent bronchitis / PNA, allergies. Acute visit- FOLLOW FOR: pt reports feeling like having a side effect from spiriva, feels skin is "crawling and tingling", having chest tightness, bringing up more mucus w bits of yellow, some increased SOB x 1 month   ROV 08/08/13 -- follow up for COPD. We had started [place] and she may have had side effects > tingling in UE, LE. She was also treated for possible bronchitis - got sick with augmentin, stopped it. She is having UA irritation, sounds like GERD sx.       Objective:   Filed Vitals:   08/08/13 1339  BP: 108/72  Pulse: 82  Temp: 97.8 F (36.6 C)  TempSrc: Oral  Height: 5' (1.524 m)  Weight: 141 lb 3.2 oz (64.048 kg)  SpO2: 97%    Gen: Pleasant, well-nourished, in no distress,  normal affect  ENT:  No lesions,  mouth clear,  oropharynx clear, no postnasal drip  Neck: No JVD, no TMG, no carotid bruits  Lungs: No use of accessory muscles, no dullness to percussion, clear without rales or rhonchi  Cardiovascular: RRR, heart sounds normal, no murmur or gallops, no peripheral edema  Musculoskeletal: No deformities, no cyanosis or clubbing  Neuro: alert, non focal  Skin: Warm, no lesions or rashes      Assessment & Plan:  Cough Suspect that GERD playing a role here.  - continue  loratadine + fluticasone - start omeprazole.   Pulmonary nodules - due for CT scan in 09/2013  Tobacco abuse Remains off    COPD (chronic obstructive pulmonary disease) continue spiriva

## 2013-08-08 NOTE — Assessment & Plan Note (Signed)
continue spiriva 

## 2013-08-08 NOTE — Addendum Note (Signed)
Addended by: Orma Flaming D on: 08/08/2013 05:01 PM   Modules accepted: Orders

## 2013-08-08 NOTE — Assessment & Plan Note (Signed)
Suspect that GERD playing a role here.  - continue loratadine + fluticasone - start omeprazole.

## 2013-08-08 NOTE — Assessment & Plan Note (Signed)
-   due for CT scan in 09/2013

## 2013-08-20 NOTE — Assessment & Plan Note (Signed)
Tingling paresthesias might be hyperventilation. Less likely to be a side effect of Spiriva, which was her concern. Increased cough and scant yellow sputum suggests a mild bronchitis exacerbation. Plan-okay to stop Spiriva for a few days, then restart, so she is sure. Augmentin.

## 2013-09-05 ENCOUNTER — Ambulatory Visit: Payer: Medicare Other | Admitting: Emergency Medicine

## 2013-09-21 ENCOUNTER — Ambulatory Visit: Payer: Medicare Other | Admitting: Internal Medicine

## 2013-09-27 ENCOUNTER — Telehealth: Payer: Self-pay | Admitting: Emergency Medicine

## 2013-09-27 NOTE — Telephone Encounter (Signed)
I spoke with pt and made her aware of recs. Nothing further needed

## 2013-09-27 NOTE — Telephone Encounter (Signed)
Called, spoke with pt.  Pt c/o skin "tingling all the time" "almost since" being on Spiriva. Pt states she mentioned this to RB during last OV, but he didn't think it was d/t the Spiriva.  Pt states she has "tried everything else," but this still doesn't go away. She still believes it is d/t the [place].  She would like to know if she can come off the Spiriva or if there is something else she can try.  Dr. Delton Coombes, pls advise.  Thank you.  [facility]  Allergies  Allergen Reactions  . Augmentin [Amoxicillin-Pot Clavulanate] Diarrhea and Nausea And Vomiting    Headache, dry heaves  . Sulfa Antibiotics Other (See Comments)    Swelling in face and tongue

## 2013-09-27 NOTE — Telephone Encounter (Signed)
Yes we should stop it to see if the sensation goes away. Have her pay close attention to the sx to see if there is a connection

## 2013-09-29 ENCOUNTER — Ambulatory Visit: Payer: Medicare Other | Admitting: Internal Medicine

## 2013-10-11 ENCOUNTER — Other Ambulatory Visit: Payer: Self-pay

## 2013-10-11 ENCOUNTER — Ambulatory Visit (INDEPENDENT_AMBULATORY_CARE_PROVIDER_SITE_OTHER)
Admission: RE | Admit: 2013-10-11 | Discharge: 2013-10-11 | Disposition: A | Discharge status: Home / Self Care | Payer: Medicare Other | Source: Ambulatory Visit | Attending: Emergency Medicine | Admitting: Emergency Medicine

## 2013-10-11 DIAGNOSIS — R918 Other nonspecific abnormal finding of lung field: Secondary | ICD-10-CM

## 2013-10-11 DIAGNOSIS — Z1231 Encounter for screening mammogram for malignant neoplasm of breast: Secondary | ICD-10-CM

## 2013-10-12 ENCOUNTER — Other Ambulatory Visit: Payer: Medicare Other

## 2013-10-18 ENCOUNTER — Ambulatory Visit (INDEPENDENT_AMBULATORY_CARE_PROVIDER_SITE_OTHER): Payer: Medicare Other | Admitting: Emergency Medicine

## 2013-10-18 ENCOUNTER — Encounter: Payer: Self-pay | Admitting: Emergency Medicine

## 2013-10-18 VITALS — BP 140/90 | HR 91 | Ht 60.0 in | Wt 146.2 lb

## 2013-10-18 DIAGNOSIS — R918 Other nonspecific abnormal finding of lung field: Secondary | ICD-10-CM

## 2013-10-18 DIAGNOSIS — J449 Chronic obstructive pulmonary disease, unspecified: Secondary | ICD-10-CM

## 2013-10-18 NOTE — Assessment & Plan Note (Signed)
CT scan 10/11/13 -- stable nodules over 2 yrs

## 2013-10-18 NOTE — Progress Notes (Signed)
Subjective:    Patient ID: Amber Holt, female    DOB: 1941/04/04, 72y.o.   MRN: [id number]  HPI 72 yo smoker (35 pk-yrs), frequent bronchitis / PNA, allergies. Continues to smoke 1 pk a day. Beginning in 11/13 she has had bouts of bronchitis and apparent COPD exacerbation. When she isn't exacerbated, she does do some wheezing. She does cough. Lately productive, but not at baseline. She has nasal gtt, contributes to cough, uses fluticasone spray. She has had CXR's during the flares that show some RUL scar, stable on serial films, last done 12/13. She is finishing up a course of omnicef.   ROV 03/08/13 -- hx tobacco, presumed COPD and recent AE. Returns for follow up. Her cough is better, but still happens. Phlegm is light in color.  Still has some wheeze. Her PND is better also on fluticasone. Using albuterol 2-3x a day. Still smoking less than 1pk/day.   ROV 04/05/13 -- COPD, mild AFL. Returns after starting Spiriva last month. She hasn't had a cigarette for over a week. For last 3 days had had more nasal congestion, more yellow chest congestion. She likes the spiriva, feels better, more energy. Rare albuterol use. No fevers, but "feels sick".   ROV 07/12/13 -- f/u for COPD.  She has not restarted smoking!  She was treated recently for an acute sinusitis with levofloxacin - better. Remains active although some fatigue. Works 3x a week, does her yard work. Rare albuterol use, happens when she is outside.   08/02/13- 78 yo smoker (35 pk-yrs), followed by Dr Delton Coombes for frequent bronchitis / PNA, allergies. Acute visit- FOLLOW FOR: pt reports feeling like having a side effect from spiriva, feels skin is "crawling and tingling", having chest tightness, bringing up more mucus w bits of yellow, some increased SOB x 1 month   ROV 08/08/13 -- follow up for COPD. We had started [place] and she may have had side effects > tingling in UE, LE. She was also treated for possible bronchitis - got sick with  augmentin, stopped it. She is having UA irritation, sounds like GERD sx.   ROV 10/18/13 -- hx COPD, rhinitis and cough. Following pulm nodules by CT, most recent 10/11/13 > nodules stable. We stopped spiriva for 10 days because there was concern that it could have been causing side effects. She has restarted the Spiriva. She is being treated by a neurologist with steroids for ??. She has been told she doesn't have MS, etc. She has cut back on her caffeine with benefit. She continues to have exertional SOB, also with bending.   PULMONARY FUNCTON TEST 03/08/2013  FVC 2.35  FEV1 1.59  FEV1/FVC 67.7  FVC  % Predicted 98  FEV % Predicted 95  FeF 25-75 .84  FeF 25-75 % Predicted 2.08      Objective:   Filed Vitals:   10/18/13 1639  BP: 140/90  Pulse: 91  Height: 5' (1.524 m)  Weight: 146 lb 3.2 oz (66.316 kg)  SpO2: 97%    Gen: Pleasant, well-nourished, in no distress,  normal affect  ENT: No lesions,  mouth clear,  oropharynx clear, no postnasal drip  Neck: No JVD, no TMG, no carotid bruits  Lungs: No use of accessory muscles, no dullness to percussion, clear without rales or rhonchi  Cardiovascular: RRR, heart sounds normal, no murmur or gallops, no peripheral edema  Musculoskeletal: No deformities, no cyanosis or clubbing  Neuro: alert, non focal  Skin: Warm, no lesions or rashes  CT scan 10/11/13 --  COMPARISON: 01/11/2013  FINDINGS:  Aortic and coronary artery atherosclerotic vascular disease noted.  Hepatic steatosis is present.  No pathologic thoracic adenopathy observed. The proximal right  subclavian artery aneurysm, 1.3 cm.  Benign 4 mm subpleural lymph nodes in the left upper lobe along the  major fissure, image 9 of series 3, no change from 2012. Additional  faint nodularity in the left upper lobe is unchanged from 2012 and  considered benign.  3 mm thick pleural-based nodule in the  Scattered small pulmonary nodules in both lungs measure 5 mm or  below,  are stable compared through 09/25/2011, and are accordingly  considered benign.  Considerable emphysema noted with scattered fibrosis in the lungs.  IMPRESSION:  1. The small pulmonary nodules in both lungs are stable through 2  years ago and accordingly are considered benign. No further imaging  workup of these nodules is necessary based on current guidelines.  This recommendation follows the consensus statement: Guidelines for  Management of Small Pulmonary Nodules Detected on CT Scans: A  Statement from the Fleischner Society as published in Radiology  2005; 237:395-400.  2. 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  3. Atherosclerosis. 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  4. Hepatic steatosis.       Assessment & Plan:  Pulmonary nodules CT scan 10/11/13 -- stable nodules over 2 yrs  COPD (chronic obstructive pulmonary disease) Restarted spiriva SABA prn rov 6

## 2013-10-18 NOTE — Patient Instructions (Signed)
Your CT scan of the chest looks good - your pulmonary nodules haven't changed over 2 years Please continue your Spiriva daily Have albuterol available to use 2 puffs if needed Follow with Dr Delton Coombes in 6 months or sooner if you have any problems

## 2013-10-18 NOTE — Assessment & Plan Note (Signed)
Restarted spiriva SABA prn rov 6

## 2013-11-20 ENCOUNTER — Ambulatory Visit
Admission: RE | Admit: 2013-11-20 | Discharge: 2013-11-20 | Disposition: A | Discharge status: Home / Self Care | Payer: Medicare Other | Source: Ambulatory Visit

## 2013-11-20 DIAGNOSIS — Z1231 Encounter for screening mammogram for malignant neoplasm of breast: Secondary | ICD-10-CM

## 2014-01-03 ENCOUNTER — Encounter (INDEPENDENT_AMBULATORY_CARE_PROVIDER_SITE_OTHER): Payer: Medicare Other | Admitting: Ophthalmology

## 2014-01-03 DIAGNOSIS — E11319 Type 2 diabetes mellitus with unspecified diabetic retinopathy without macular edema: Secondary | ICD-10-CM

## 2014-01-03 DIAGNOSIS — E1165 Type 2 diabetes mellitus with hyperglycemia: Secondary | ICD-10-CM

## 2014-01-03 DIAGNOSIS — E1139 Type 2 diabetes mellitus with other diabetic ophthalmic complication: Secondary | ICD-10-CM

## 2014-01-03 DIAGNOSIS — H43819 Vitreous degeneration, unspecified eye: Secondary | ICD-10-CM

## 2014-03-05 ENCOUNTER — Encounter (INDEPENDENT_AMBULATORY_CARE_PROVIDER_SITE_OTHER): Payer: Medicare Other | Admitting: Ophthalmology

## 2014-03-07 ENCOUNTER — Encounter (INDEPENDENT_AMBULATORY_CARE_PROVIDER_SITE_OTHER): Payer: Medicare Other | Admitting: Ophthalmology

## 2014-03-07 DIAGNOSIS — E1139 Type 2 diabetes mellitus with other diabetic ophthalmic complication: Secondary | ICD-10-CM

## 2014-03-07 DIAGNOSIS — E11319 Type 2 diabetes mellitus with unspecified diabetic retinopathy without macular edema: Secondary | ICD-10-CM

## 2014-03-07 DIAGNOSIS — H43819 Vitreous degeneration, unspecified eye: Secondary | ICD-10-CM

## 2014-03-07 DIAGNOSIS — E1165 Type 2 diabetes mellitus with hyperglycemia: Secondary | ICD-10-CM

## 2014-03-19 ENCOUNTER — Other Ambulatory Visit: Payer: Self-pay | Admitting: *Deleted

## 2014-03-19 DIAGNOSIS — J449 Chronic obstructive pulmonary disease, unspecified: Secondary | ICD-10-CM

## 2014-03-19 MED ORDER — TIOTROPIUM BROMIDE MONOHYDRATE 18 MCG IN CAPS: 18.0000 ug | ORAL_CAPSULE | Freq: Every day | RESPIRATORY_TRACT | Status: DC

## 2014-08-22 ENCOUNTER — Encounter: Payer: Self-pay | Admitting: Emergency Medicine

## 2014-08-22 ENCOUNTER — Ambulatory Visit (INDEPENDENT_AMBULATORY_CARE_PROVIDER_SITE_OTHER): Payer: Medicare Other | Admitting: Emergency Medicine

## 2014-08-22 VITALS — BP 102/68 | HR 86 | Ht 60.0 in | Wt 151.0 lb

## 2014-08-22 DIAGNOSIS — J449 Chronic obstructive pulmonary disease, unspecified: Secondary | ICD-10-CM

## 2014-08-22 DIAGNOSIS — J4489 Other specified chronic obstructive pulmonary disease: Secondary | ICD-10-CM

## 2014-08-22 NOTE — Patient Instructions (Addendum)
Please continue Spiriva once a day Use albuterol 2 puffs if needed for shortness of breath Continue fluticasone nasal spray and loratadine Follow with Dr Delton Coombes in 6 months or sooner if you have any problems

## 2014-08-22 NOTE — Progress Notes (Signed)
Subjective:    Patient ID: Amber Holt, female    DOB: September 17, 1941, 73y.o.   MRN: [id number]  HPI 73 yo smoker (35 pk-yrs), frequent bronchitis / PNA, allergies. Continues to smoke 1 pk a day. Beginning in 11/13 she has had bouts of bronchitis and apparent COPD exacerbation. When she isn't exacerbated, she does do some wheezing. She does cough. Lately productive, but not at baseline. She has nasal gtt, contributes to cough, uses fluticasone spray. She has had CXR's during the flares that show some RUL scar, stable on serial films, last done 12/13. She is finishing up a course of omnicef.   ROV 03/08/13 -- hx tobacco, presumed COPD and recent AE. Returns for follow up. Her cough is better, but still happens. Phlegm is light in color.  Still has some wheeze. Her PND is better also on fluticasone. Using albuterol 2-3x a day. Still smoking less than 1pk/day.   ROV 04/05/13 -- COPD, mild AFL. Returns after starting Spiriva last month. She hasn't had a cigarette for over a week. For last 3 days had had more nasal congestion, more yellow chest congestion. She likes the spiriva, feels better, more energy. Rare albuterol use. No fevers, but "feels sick".   ROV 07/12/13 -- f/u for COPD.  She has not restarted smoking!  She was treated recently for an acute sinusitis with levofloxacin - better. Remains active although some fatigue. Works 3x a week, does her yard work. Rare albuterol use, happens when she is outside.   08/02/13- 73 yo smoker (35 pk-yrs), followed by Dr Delton Coombes for frequent bronchitis / PNA, allergies. Acute visit- FOLLOW FOR: pt reports feeling like having a side effect from spiriva, feels skin is "crawling and tingling", having chest tightness, bringing up more mucus w bits of yellow, some increased SOB x 1 month   ROV 08/08/13 -- follow up for COPD. We had started [place] and she may have had side effects > tingling in UE, LE. She was also treated for possible bronchitis - got sick with  augmentin, stopped it. She is having UA irritation, sounds like GERD sx.   ROV 10/18/13 -- hx COPD, rhinitis and cough. Following pulm nodules by CT, most recent 10/11/13 > nodules stable. We stopped spiriva for 10 days because there was concern that it could have been causing side effects. She has restarted the Spiriva. She is being treated by a neurologist with steroids for ??. She has been told she doesn't have MS, etc. She has cut back on her caffeine with benefit. She continues to have exertional SOB, also with bending.   ROV 08/22/14 -- followup visit for COPD, rhinitis, chronic cough and primary nodules. Last visit we restarted her Spiriva due to exertional dyspnea. She returns today with fairly stable breathing. Rarely uses albuterol. She is having some UA irritation and am mucous. She is having some GERD sx even on PPI. She is not coughing the rest of the day.    PULMONARY FUNCTON TEST 03/08/2013  FVC 2.35  FEV1 1.59  FEV1/FVC 67.7  FVC  % Predicted 98  FEV % Predicted 95  FeF 25-75 .84  FeF 25-75 % Predicted 2.08      Objective:   Filed Vitals:   08/22/14 1602  BP: 102/68  Pulse: 86  Height: 5' (1.524 m)  Weight: 151 lb (68.493 kg)  SpO2: 98%    Gen: Pleasant, well-nourished, in no distress,  normal affect  ENT: No lesions,  mouth clear,  oropharynx clear, no postnasal  drip  Neck: No JVD, no TMG, no carotid bruits  Lungs: No use of accessory muscles, no dullness to percussion, clear without rales or rhonchi  Cardiovascular: RRR, heart sounds normal, no murmur or gallops, no peripheral edema  Musculoskeletal: No deformities, no cyanosis or clubbing  Neuro: alert, non focal  Skin: Warm, no lesions or rashes   CT scan 10/11/13 --  COMPARISON: 01/11/2013  FINDINGS:  Aortic and coronary artery atherosclerotic vascular disease noted.  Hepatic steatosis is present.  No pathologic thoracic adenopathy observed. The proximal right  subclavian artery aneurysm, 1.3 cm.   Benign 4 mm subpleural lymph nodes in the left upper lobe along the  major fissure, image 9 of series 3, no change from 2012. Additional  faint nodularity in the left upper lobe is unchanged from 2012 and  considered benign.  3 mm thick pleural-based nodule in the  Scattered small pulmonary nodules in both lungs measure 5 mm or  below, are stable compared through 09/25/2011, and are accordingly  considered benign.  Considerable emphysema noted with scattered fibrosis in the lungs.  IMPRESSION:  1. The small pulmonary nodules in both lungs are stable through 2  years ago and accordingly are considered benign. No further imaging  workup of these nodules is necessary based on current guidelines.  This recommendation follows the consensus statement: Guidelines for  Management of Small Pulmonary Nodules Detected on CT Scans: A  Statement from the Fleischner Society as published in Radiology  2005; 237:395-400.  2. 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  3. Atherosclerosis. 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  4. Hepatic steatosis.       Assessment & Plan:  COPD (chronic obstructive pulmonary disease) Appears to be clinically stable. She is having some upper airway irritation symptoms that amount to some mucus clearing every morning. She is not having any long-standing cough or short-windedness. At this time will continue her Spiriva and her allergy/GERD regimens

## 2014-08-22 NOTE — Assessment & Plan Note (Signed)
Appears to be clinically stable. She is having some upper airway irritation symptoms that amount to some mucus clearing every morning. She is not having any long-standing cough or short-windedness. At this time will continue her Spiriva and her allergy/GERD regimens

## 2014-10-24 ENCOUNTER — Other Ambulatory Visit: Payer: Self-pay

## 2014-10-24 DIAGNOSIS — Z1231 Encounter for screening mammogram for malignant neoplasm of breast: Secondary | ICD-10-CM

## 2014-11-26 ENCOUNTER — Ambulatory Visit
Admission: RE | Admit: 2014-11-26 | Discharge: 2014-11-26 | Disposition: A | Discharge status: Home / Self Care | Payer: Medicare Other | Source: Ambulatory Visit

## 2014-11-26 DIAGNOSIS — Z1231 Encounter for screening mammogram for malignant neoplasm of breast: Secondary | ICD-10-CM

## 2014-11-27 ENCOUNTER — Telehealth: Payer: Self-pay | Admitting: Emergency Medicine

## 2014-11-27 MED ORDER — TIOTROPIUM BROMIDE MONOHYDRATE 18 MCG IN CAPS: 18.0000 ug | ORAL_CAPSULE | Freq: Every day | RESPIRATORY_TRACT | Status: DC

## 2014-11-27 NOTE — Telephone Encounter (Signed)
2 samples left at front for pick up. Pt is aware. Nothing more needed at this time.

## 2015-03-04 ENCOUNTER — Ambulatory Visit: Payer: Medicare Other | Admitting: Emergency Medicine

## 2015-04-16 ENCOUNTER — Ambulatory Visit: Payer: Medicare Other | Admitting: Emergency Medicine

## 2015-04-17 ENCOUNTER — Telehealth: Payer: Self-pay | Admitting: Emergency Medicine

## 2015-04-17 MED ORDER — TIOTROPIUM BROMIDE MONOHYDRATE 18 MCG IN CAPS: 18.0000 ug | ORAL_CAPSULE | Freq: Every day | RESPIRATORY_TRACT | Status: DC

## 2015-04-17 NOTE — Telephone Encounter (Signed)
Spoke with pt. She would like to have enough medication until July. Pt is aware that she is due for an appointment but can't come in until July due to vacations and family visiting. Advised that I would send in her medication but this would the last refill until she is seen. She verbalized understanding and agreed.

## 2015-07-16 ENCOUNTER — Other Ambulatory Visit: Payer: Self-pay | Admitting: *Deleted

## 2015-07-16 MED ORDER — TIOTROPIUM BROMIDE MONOHYDRATE 18 MCG IN CAPS: 18.0000 ug | ORAL_CAPSULE | Freq: Every day | RESPIRATORY_TRACT | Status: DC

## 2015-07-30 ENCOUNTER — Ambulatory Visit (INDEPENDENT_AMBULATORY_CARE_PROVIDER_SITE_OTHER): Payer: Medicare Other | Admitting: Emergency Medicine

## 2015-07-30 ENCOUNTER — Encounter: Payer: Self-pay | Admitting: Emergency Medicine

## 2015-07-30 VITALS — BP 110/82 | HR 87 | Ht 60.0 in | Wt 146.4 lb

## 2015-07-30 DIAGNOSIS — R918 Other nonspecific abnormal finding of lung field: Secondary | ICD-10-CM | POA: Diagnosis not present

## 2015-07-30 DIAGNOSIS — Z87891 Personal history of nicotine dependence: Secondary | ICD-10-CM

## 2015-07-30 DIAGNOSIS — J449 Chronic obstructive pulmonary disease, unspecified: Secondary | ICD-10-CM | POA: Diagnosis not present

## 2015-07-30 NOTE — Assessment & Plan Note (Signed)
Continue Spiriva although I would like to confirm her insurance Tier, ? Why the increase in cost. We may need to consider an alternative based on cost

## 2015-07-30 NOTE — Patient Instructions (Signed)
Please continue your Spiriva daily for now. We will need to check with her insurance company to determine if this medication is changed Tiers.  Use albuterol as needed We will refer you to the lung cancer screening program.  Follow with Dr Delton Coombes in 12 months or sooner if you have any problems

## 2015-07-30 NOTE — Assessment & Plan Note (Signed)
Stable for 2 years by CT scan in October 2014

## 2015-07-30 NOTE — Progress Notes (Signed)
Subjective:    Patient ID: Amber Holt, female    DOB: Jun 01, 1941, 73y.o.   MRN: [id number]  HPI 74 yo smoker (35 pk-yrs), frequent bronchitis / PNA, allergies. Continues to smoke 1 pk a day. Beginning in 11/13 she has had bouts of bronchitis and apparent COPD exacerbation. When she isn't exacerbated, she does do some wheezing. She does cough. Lately productive, but not at baseline. She has nasal gtt, contributes to cough, uses fluticasone spray. She has had CXR's during the flares that show some RUL scar, stable on serial films, last done 12/13. She is finishing up a course of omnicef.   ROV 03/08/13 -- hx tobacco, presumed COPD and recent AE. Returns for follow up. Her cough is better, but still happens. Phlegm is light in color.  Still has some wheeze. Her PND is better also on fluticasone. Using albuterol 2-3x a day. Still smoking less than 1pk/day.   ROV 04/05/13 -- COPD, mild AFL. Returns after starting Spiriva last month. She hasn't had a cigarette for over a week. For last 3 days had had more nasal congestion, more yellow chest congestion. She likes the spiriva, feels better, more energy. Rare albuterol use. No fevers, but "feels sick".   ROV 07/12/13 -- f/u for COPD.  She has not restarted smoking!  She was treated recently for an acute sinusitis with levofloxacin - better. Remains active although some fatigue. Works 3x a week, does her yard work. Rare albuterol use, happens when she is outside.   08/02/13- 61 yo smoker (35 pk-yrs), followed by Dr Delton Coombes for frequent bronchitis / PNA, allergies. Acute visit- FOLLOW FOR: pt reports feeling like having a side effect from spiriva, feels skin is "crawling and tingling", having chest tightness, bringing up more mucus w bits of yellow, some increased SOB x 1 month   ROV 08/08/13 -- follow up for COPD. We had started [place] and she may have had side effects > tingling in UE, LE. She was also treated for possible bronchitis - got sick with  augmentin, stopped it. She is having UA irritation, sounds like GERD sx.   ROV 10/18/13 -- hx COPD, rhinitis and cough. Following pulm nodules by CT, most recent 10/11/13 > nodules stable. We stopped spiriva for 10 days because there was concern that it could have been causing side effects. She has restarted the Spiriva. She is being treated by a neurologist with steroids for ??. She has been told she doesn't have MS, etc. She has cut back on her caffeine with benefit. She continues to have exertional SOB, also with bending.   ROV 08/22/14 -- followup visit for COPD, rhinitis, chronic cough and primary nodules. Last visit we restarted her Spiriva due to exertional dyspnea. She returns today with fairly stable breathing. Rarely uses albuterol. She is having some UA irritation and am mucous. She is having some GERD sx even on PPI. She is not coughing the rest of the day.   ROV 07/30/15 -- follow up visit for COPD, hx stable pulm nodules Last CT scan performed in 2014 Spiriva and albuterol prn, uses rarely. Concern about the recent increase in cost by 100.00 per month.  Some cough. Rare wheeze.  No flares.    PULMONARY FUNCTON TEST 03/08/2013  FVC 2.35  FEV1 1.59  FEV1/FVC 67.7  FVC  % Predicted 98  FEV % Predicted 95  FeF 25-75 .84  FeF 25-75 % Predicted 2.08      Objective:   Filed Vitals:   07/30/15  0934  BP: 110/82  Pulse: 87  Height: 5' (1.524 m)  Weight: 146 lb 6.4 oz (66.407 kg)  SpO2: 94%    Gen: Pleasant, well-nourished, in no distress,  normal affect  ENT: No lesions,  mouth clear,  oropharynx clear, no postnasal drip  Neck: No JVD, no TMG, no carotid bruits  Lungs: No use of accessory muscles, no dullness to percussion, clear without rales or rhonchi  Cardiovascular: RRR, heart sounds normal, no murmur or gallops, no peripheral edema  Musculoskeletal: No deformities, no cyanosis or clubbing  Neuro: alert, non focal  Skin: Warm, no lesions or rashes   CT scan  10/11/13 --  COMPARISON: 01/11/2013  FINDINGS:  Aortic and coronary artery atherosclerotic vascular disease noted.  Hepatic steatosis is present.  No pathologic thoracic adenopathy observed. The proximal right  subclavian artery aneurysm, 1.3 cm.  Benign 4 mm subpleural lymph nodes in the left upper lobe along the  major fissure, image 9 of series 3, no change from 2012. Additional  faint nodularity in the left upper lobe is unchanged from 2012 and  considered benign.  3 mm thick pleural-based nodule in the  Scattered small pulmonary nodules in both lungs measure 5 mm or  below, are stable compared through 09/25/2011, and are accordingly  considered benign.  Considerable emphysema noted with scattered fibrosis in the lungs.  IMPRESSION:  1. The small pulmonary nodules in both lungs are stable through 2  years ago and accordingly are considered benign. No further imaging  workup of these nodules is necessary based on current guidelines.  This recommendation follows the consensus statement: Guidelines for  Management of Small Pulmonary Nodules Detected on CT Scans: A  Statement from the Fleischner Society as published in Radiology  2005; 237:395-400.  2. 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  3. Atherosclerosis. 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  4. Hepatic steatosis.       Assessment & Plan:  Personal history of tobacco use, presenting hazards to health She does meet criteria for lung cancer screening. I discussed this with her today and she is interested. I will refer her to the lung cancer screening program  Pulmonary nodules Stable for 2 years by CT scan in October 2014  COPD (chronic obstructive pulmonary disease) Continue Spiriva although I would like to confirm her insurance Tier, ? Why the increase in cost. We may need to consider an alternative based on cost

## 2015-07-30 NOTE — Assessment & Plan Note (Signed)
She does meet criteria for lung cancer screening. I discussed this with her today and she is interested. I will refer her to the lung cancer screening program

## 2015-08-12 ENCOUNTER — Encounter: Payer: Self-pay | Admitting: Acute Care

## 2015-08-12 ENCOUNTER — Telehealth: Payer: Self-pay | Admitting: Acute Care

## 2015-08-12 ENCOUNTER — Other Ambulatory Visit: Payer: Self-pay | Admitting: Acute Care

## 2015-08-12 DIAGNOSIS — F17210 Nicotine dependence, cigarettes, uncomplicated: Principal | ICD-10-CM

## 2015-08-12 NOTE — Telephone Encounter (Signed)
I have scheduled Amber Holt for her lung cancer screening at the request of Dr. Delton Coombes. She is scheduled for Aug. 30th at 10 am to meet with me and her CT is scheduled at 11am. She verbalized understanding of both the time and location of the appointments.She has my contact information in the event she has any further questions.

## 2015-08-14 ENCOUNTER — Telehealth: Payer: Self-pay | Admitting: Emergency Medicine

## 2015-08-14 MED ORDER — TIOTROPIUM BROMIDE MONOHYDRATE 18 MCG IN CAPS: 18.0000 ug | ORAL_CAPSULE | Freq: Every day | RESPIRATORY_TRACT | Status: DC

## 2015-08-14 NOTE — Telephone Encounter (Signed)
Pt informed that refill for Spiriva was sent to pharmacy. 

## 2015-08-20 ENCOUNTER — Telehealth: Payer: Self-pay | Admitting: Emergency Medicine

## 2015-08-20 ENCOUNTER — Encounter: Payer: Self-pay | Admitting: Acute Care

## 2015-08-20 ENCOUNTER — Ambulatory Visit (INDEPENDENT_AMBULATORY_CARE_PROVIDER_SITE_OTHER): Payer: Medicare Other | Admitting: Acute Care

## 2015-08-20 ENCOUNTER — Ambulatory Visit (INDEPENDENT_AMBULATORY_CARE_PROVIDER_SITE_OTHER)
Admission: RE | Admit: 2015-08-20 | Discharge: 2015-08-20 | Disposition: A | Discharge status: Home / Self Care | Payer: Medicare Other | Source: Ambulatory Visit | Attending: Acute Care | Admitting: Acute Care

## 2015-08-20 DIAGNOSIS — F17210 Nicotine dependence, cigarettes, uncomplicated: Secondary | ICD-10-CM

## 2015-08-20 NOTE — Progress Notes (Signed)
Shared Decision Making Visit Lung Cancer Screening Program [id number])   Eligibility:  Age 74 y.o.  Pack Years Smoking History Calculation: 35 pack years (# packs/per year x # years smoked)  Recent History of coughing up blood  no  Unexplained weight loss? no ( >Than 15 pounds within the last 6 months )  Prior History Lung / other cancer no (Diagnosis within the last 5 years already requiring surveillance chest CT Scans).  Smoking Status Current Smoker  Former Smokers: Years since quit:NA  Quit Date: NA  Visit Components:  Discussion included one or more decision making aids. yes  Discussion included risk/benefits of screening. yes  Discussion included potential follow up diagnostic testing for abnormal scans. yes  Discussion included meaning and risk of over diagnosis. yes  Discussion included meaning and risk of False Positives. yes  Discussion included meaning of total radiation exposure. yes  Counseling Included:  Importance of adherence to annual lung cancer LDCT screening. yes  Impact of comorbidities on ability to participate in the program. yes  Ability and willingness to under diagnostic treatment. yes  Smoking Cessation Counseling:  Current Smokers:   Discussed importance of smoking cessation. Yes  Information about tobacco cessation classes and interventions provided to patient.YES  Patient provided with "ticket" for LDCT Scan. Yes  Symptomatic Patient. NA  Counseling:NA  Diagnosis Code: Tobacco Use Z72.0  Asymptomatic Patient : yes  Counseling::Yes  Former Smokers:   Discussed the importance of maintaining cigarette abstinence.NA  Diagnosis Code: Personal History of Nicotine Dependence. W10.272  Information about tobacco cessation classes and interventions provided to patient. Yes  Patient provided with "ticket" for LDCT Scan. yes  Written Order for Lung Cancer Screening with LDCT placed in Epic. Yes (CT Chest Lung Cancer Screening  Low Dose W/O CM) [id number] Z12.2-Screening of respiratory organs Z87.891-Personal history of nicotine dependence  I spent 15 minutes of face to face time with Ms. Guymon discussing the risks and benefits of the lung cancer screening program. We viewed a power point together discussing the specifics of the program as noted above, pausing at intervals to allow for questions to be asked and answered to ensure understanding of all concepts. We discussed that the most powerful action that she could take would be to quit smoking. She has quit in the past using nicoderm patches and vapor cigarettes. She is motivated to try again, but does not want Wellbutrin or Chantix. She wants to try what worked before. I have given her the " Be stronger than your excuses" card with the Quit Smart number for free nicotine replacement therapy. She has promised she will call me if she needs anything else to help with her goal of becoming smoke free. I have given her a copy of the power point we viewed together to refer to as need in the future, in addition to my card and contact information in the event she has any further questions. She verbalized understanding of the time and location of her CT scan appointment and had no further questions upon leaving the office. I told her I would call with the results in the next day or so. She verbalized understanding.Bevelyn Ngo, NP

## 2015-08-20 NOTE — Telephone Encounter (Signed)
Pt aware we do not have any Spiriva samples. Nothing further needed at this time.

## 2015-08-21 ENCOUNTER — Telehealth: Payer: Self-pay | Admitting: Acute Care

## 2015-08-21 NOTE — Telephone Encounter (Signed)
I called to give Amber Holt the results of her scan. I explained that her scan was read as a Lung Rads 2, which indicates nodules with a very low likelihood of becoming a clinicalyl active cancer due to size or lack of growth. I explained that the recommendation was for a repeat scan in 12 month to ensure nothing has changed.I told her I would call her at the beginning of August 2017 to get her scheduled. She verbalized understanding and had no further questions. She has my contact information if she needs to contact me. I will give Dr. Delton Coombes the results of the scan as he was the referring MD.

## 2015-10-22 ENCOUNTER — Other Ambulatory Visit: Payer: Self-pay

## 2015-10-22 DIAGNOSIS — Z1231 Encounter for screening mammogram for malignant neoplasm of breast: Secondary | ICD-10-CM

## 2015-11-12 ENCOUNTER — Telehealth: Payer: Self-pay | Admitting: Emergency Medicine

## 2015-11-12 NOTE — Telephone Encounter (Signed)
Patient calling for sample of Spiriva.  We do not have samples of Spiriva. Called and left detailed message on patient's voicemail advising her that we do not have any samples of sprivia. Nothing further needed. Closing encounter

## 2015-12-02 ENCOUNTER — Ambulatory Visit
Admission: RE | Admit: 2015-12-02 | Discharge: 2015-12-02 | Disposition: A | Discharge status: Home / Self Care | Payer: Medicare Other | Source: Ambulatory Visit

## 2015-12-02 DIAGNOSIS — Z1231 Encounter for screening mammogram for malignant neoplasm of breast: Secondary | ICD-10-CM

## 2015-12-03 DIAGNOSIS — E119 Type 2 diabetes mellitus without complications: Secondary | ICD-10-CM | POA: Insufficient documentation

## 2015-12-03 DIAGNOSIS — I10 Essential (primary) hypertension: Secondary | ICD-10-CM | POA: Insufficient documentation

## 2015-12-03 DIAGNOSIS — F419 Anxiety disorder, unspecified: Secondary | ICD-10-CM | POA: Insufficient documentation

## 2016-01-20 ENCOUNTER — Telehealth: Payer: Self-pay | Admitting: Emergency Medicine

## 2016-01-20 MED ORDER — PREDNISONE 10 MG PO TABS: ORAL_TABLET | ORAL | Status: DC

## 2016-01-20 MED ORDER — AZITHROMYCIN 250 MG PO TABS: ORAL_TABLET | ORAL | Status: AC

## 2016-01-20 NOTE — Telephone Encounter (Signed)
Spoke with pt. Reports chest congestion, cough and wheezing for 2 weeks. Cough is producing green/yellow mucus. Denies chest tightness, SOB or fever. Has not tried any OTC medications. Would like for an antibiotic to be sent in.  RB - please advise. Thanks.

## 2016-01-20 NOTE — Telephone Encounter (Signed)
Please give azithromycin  > z-pack Prednisone > Take  daily for 3 days, then  daily for 3 days, then  daily for 3 days, then  daily for 3 days, then stop

## 2016-01-20 NOTE — Telephone Encounter (Signed)
Pt is aware of RB's recommendations. Rxs have been sent in. Nothing further was needed. 

## 2016-03-11 ENCOUNTER — Other Ambulatory Visit: Payer: Self-pay | Admitting: Acute Care

## 2016-03-11 DIAGNOSIS — F17210 Nicotine dependence, cigarettes, uncomplicated: Principal | ICD-10-CM

## 2016-03-18 ENCOUNTER — Telehealth: Payer: Self-pay | Admitting: Emergency Medicine

## 2016-03-18 MED ORDER — DOXYCYCLINE HYCLATE 100 MG PO TABS: 100.0000 mg | ORAL_TABLET | Freq: Two times a day (BID) | ORAL | Status: DC

## 2016-03-18 MED ORDER — PREDNISONE 10 MG PO TABS: ORAL_TABLET | ORAL | Status: DC

## 2016-03-18 NOTE — Telephone Encounter (Signed)
Spoke with pt. States that she has bronchitis. Reports coughing and wheezing x2 days. Cough is producing green/yellow mucus. Denies chest tightness, SOB or fever. Has been taking Mucinex with no relief. Would like an antibiotic sent in.  Spoke with RB over the phone. He recommends giving the pt >> doxy 100mg  BID #14 and prednisone taper.  Pt is aware of RB's recommendations. Rx's have been sent in. Nothing further was needed.

## 2016-05-19 ENCOUNTER — Telehealth: Payer: Self-pay | Admitting: Emergency Medicine

## 2016-05-19 MED ORDER — PREDNISONE 10 MG PO TABS: ORAL_TABLET | ORAL | Status: DC

## 2016-05-19 MED ORDER — AZITHROMYCIN 250 MG PO TABS: ORAL_TABLET | ORAL | Status: DC

## 2016-05-19 NOTE — Telephone Encounter (Signed)
Called, spoke with pt.  C/o chest congestion, cough with yellow to green mucus, wheezing, and PND x 1 wks.  Denies increased SOB, chest tightness, CP, sinus pressure, HA, or f/c/s.  Using mucinex and albuterol hfa x 1 wk with some relief.  Offered OV - pt declined, requesting recs over the phone and rx.  Dr. Delton CoombesByrum, please advise.  Thank you.  [facility]

## 2016-05-19 NOTE — Telephone Encounter (Signed)
Ok to treat w pred taper Take 40mg  daily for 3 days, then 30mg  daily for 3 days, then 20mg  daily for 3 days, then 10mg  daily for 3 days, then stop azithro z pack

## 2016-05-19 NOTE — Telephone Encounter (Signed)
Patient notified of Dr. Byrum's recommendations.  Rx sent to pharmacy. Nothing further needed.  

## 2016-05-22 ENCOUNTER — Telehealth: Payer: Self-pay | Admitting: Emergency Medicine

## 2016-05-22 NOTE — Telephone Encounter (Signed)
Spoke with pt and she states that she has been taking the pred taper and Zpak that were sent in on 5/30. Pt c/o continued cough with brown/green mucus, wheeze, SOB and now some mid-sternal pain that radiates to under both shoulder blades and is worse when coughing. Pt states that the pain is improving. She would like to know if this could indicate pneumonia and/or what can she do for the pain.  RB please advise. Thanks!!

## 2016-05-22 NOTE — Telephone Encounter (Signed)
She needs to be seen.  If she needs something for cough suppression then I would recommend delsym q12h prn.

## 2016-05-22 NOTE — Telephone Encounter (Signed)
Spoke with pt and gave RB's recommendations. She is scheduled with TP 05/25/16 @ 3pm. Pt adivsed to seek emergency care if CP changes or worsens. Nothing further needed.

## 2016-05-22 NOTE — Telephone Encounter (Signed)
RB has not responded.  MW please advise.   LOV  07/30/15 with RB  Patient Instructions     Please continue your Spiriva daily for now. We will need to check with her insurance company to determine if this medication is changed Tiers.  Use albuterol as needed We will refer you to the lung cancer screening program.  Follow with Dr Delton CoombesByrum in 12 months or sooner if you have any problems

## 2016-05-25 ENCOUNTER — Ambulatory Visit (INDEPENDENT_AMBULATORY_CARE_PROVIDER_SITE_OTHER): Payer: Medicare Other | Admitting: Adult Health

## 2016-05-25 ENCOUNTER — Ambulatory Visit (INDEPENDENT_AMBULATORY_CARE_PROVIDER_SITE_OTHER)
Admission: RE | Admit: 2016-05-25 | Discharge: 2016-05-25 | Disposition: A | Discharge status: Home / Self Care | Payer: Medicare Other | Source: Ambulatory Visit | Attending: Adult Health | Admitting: Adult Health

## 2016-05-25 ENCOUNTER — Encounter: Payer: Self-pay | Admitting: Adult Health

## 2016-05-25 VITALS — BP 134/66 | HR 94 | Temp 98.2°F | Ht 60.0 in | Wt 148.0 lb

## 2016-05-25 DIAGNOSIS — J441 Chronic obstructive pulmonary disease with (acute) exacerbation: Secondary | ICD-10-CM

## 2016-05-25 MED ORDER — LEVOFLOXACIN 500 MG PO TABS: 500.0000 mg | ORAL_TABLET | Freq: Every day | ORAL | Status: DC

## 2016-05-25 NOTE — Assessment & Plan Note (Addendum)
Slow to resolve exacerbation  Check cxr  Smoking cessation  Advised on yogurt or probiotic while on abx  Plan  Begin Levaquin 500mg  daily for 7 days .  Finish Prednisone taper over next week.  Mucinex DM Twice daily  As needed  Cough/congestion  Chest xray today .  follow up Dr. Delton CoombesByrum  In 3 months and As needed   Please contact office for sooner follow up if symptoms do not improve or worsen or seek emergency care

## 2016-05-25 NOTE — Addendum Note (Signed)
Addended by: Karalee HeightOX, Ylonda Storr P on: 05/25/2016 03:24 PM   Modules accepted: Orders

## 2016-05-25 NOTE — Patient Instructions (Signed)
Begin Levaquin 500mg  daily for 7 days .  Finish Prednisone taper over next week.  Mucinex DM Twice daily  As needed  Cough/congestion  Chest xray today .  follow up Dr. Delton CoombesByrum  In 3 months and As needed   Please contact office for sooner follow up if symptoms do not improve or worsen or seek emergency care

## 2016-05-25 NOTE — Progress Notes (Signed)
Subjective:    Patient ID: Amber Holt, female    DOB: 06-Jul-1941, 75 y.o.   MRN: [id number]  HPI  75 year old smoker with COPD and pulmonary nodules (stable since 2014)    05/25/2016 Acute OV : COPD  Patient presents for an acute office visit Patient complains of prod cough with light green/yellow colored mucus, sinus pressure/drainage, chest congestion, increased SOB and wheezing x 1 week.  She was called in a Z-Pak and a prednisone taper 1 week ago.  Seen some improvement but continues to have lingering cough and congestion. Mucus remains light green.  Denies chest pain, orthopnea, edema , fever or hemoptysis  Remains on Spiriva daily  Has been using robitussin.  Still smoking , discussed cessation .     Past Medical History  Diagnosis Date  . Asthma   . Allergic rhinitis    Current Outpatient Prescriptions on File Prior to Visit  Medication Sig Dispense Refill  . albuterol (PROVENTIL HFA;VENTOLIN HFA) 108 (90 BASE) MCG/ACT inhaler Inhale 2 puffs into the lungs every 6 (six) hours as needed.    Marland Kitchen. dextromethorphan-guaiFENesin (MUCINEX DM) 30-600 MG per 12 hr tablet Take 1 tablet by mouth every 12 (twelve) hours.    Marland Kitchen. doxycycline (VIBRA-TABS) 100 MG tablet Take 1 tablet (100 mg total) by mouth 2 (two) times daily. 14 tablet 0  . escitalopram (LEXAPRO) 10 MG tablet Take 10 mg by mouth daily.    . fluticasone (FLONASE) 50 MCG/ACT nasal spray Place 1 spray into the nose daily.    Marland Kitchen. ibuprofen (ADVIL,MOTRIN) 200 MG tablet Take 200 mg by mouth every 6 (six) hours as needed for pain.    . Levothyroxine Sodium (SYNTHROID PO) Take by mouth.    . loratadine (CLARITIN) 10 MG tablet Take 10 mg by mouth daily.    . Losartan Potassium (COZAAR PO) Take by mouth.    . predniSONE (DELTASONE) 10 MG tablet Take 4 tablets for 3 days, 3 tablets for 3 days, 2 tablets for 3 days, 1 tablet for 3 days 30 tablet 0  . tiotropium (SPIRIVA) 18 MCG inhalation capsule Place 1 capsule (18 mcg total)  into inhaler and inhale daily. 30 capsule 11  . Cholecalciferol (VITAMIN D3 PO) Take by mouth. Reported on 05/25/2016    . METFORMIN HCL PO Take by mouth. Reported on 05/25/2016    . omeprazole (PRILOSEC) 20 MG capsule Take 1 capsule (20 mg total) by mouth daily. (Patient not taking: Reported on 05/25/2016) 30 capsule 11  . potassium chloride (K-DUR) 10 MEQ tablet Take 10 mEq by mouth daily. Reported on 05/25/2016    . predniSONE (DELTASONE) 10 MG tablet Take 4 tabs with food x 3 days, 3 tabs x 3 days, 2 tabs x 3 days, 1 tab x 3 days, then STOP (Patient not taking: Reported on 05/25/2016) 30 tablet 0   No current facility-administered medications on file prior to visit.    Review of Systems Constitutional:   No  weight loss, night sweats,  Fevers, chills, fatigue, or  lassitude.  HEENT:   No headaches,  Difficulty swallowing,  Tooth/dental problems, or  Sore throat,                No sneezing, itching, ear ache, nasal congestion, post nasal drip,   CV:  No chest pain,  Orthopnea, PND, swelling in lower extremities, anasarca, dizziness, palpitations, syncope.   GI  No heartburn, indigestion, abdominal pain, nausea, vomiting, diarrhea, change in bowel habits, loss of appetite,  bloody stools.   Resp: No shortness of breath with exertion or at rest.  No excess mucus, no productive cough,  No non-productive cough,  No coughing up of blood.  No change in color of mucus.  No wheezing.  No chest wall deformity  Skin: no rash or lesions.  GU: no dysuria, change in color of urine, no urgency or frequency.  No flank pain, no hematuria   MS:  No joint pain or swelling.  No decreased range of motion.  No back pain.  Psych:  No change in mood or affect. No depression or anxiety.  No memory loss.         Objective:   Physical Exam  Filed Vitals:   05/25/16 1458  BP: 134/66  Pulse: 94  Temp: 98.2 F (36.8 C)  TempSrc: Oral  Height: 5' (1.524 m)  Weight: 148 lb (67.132 kg)  SpO2: 94%   GEN:  A/Ox3; pleasant , NAD, well nourished   HEENT:  [place]/AT,  EACs-clear, TMs-wnl, NOSE-clear, THROAT-clear, no lesions, no postnasal drip or exudate noted.   NECK:  Supple w/ fair ROM; no JVD; normal carotid impulses w/o bruits; no thyromegaly or nodules palpated; no lymphadenopathy.  RESP  Clear  P & A; w/o, wheezes/ rales/ or rhonchi.no accessory muscle use, no dullness to percussion  CARD:  RRR, no m/r/g  , no peripheral edema, pulses intact, no cyanosis or clubbing.  GI:   Soft & nt; nml bowel sounds; no organomegaly or masses detected.  Musco: Warm bil, no deformities or joint swelling noted.   Neuro: alert, no focal deficits noted.    Skin: Warm, no lesions or rashes  Ananiah Maciolek NP-C  [facility]  05/25/2016       Assessment & Plan:

## 2016-06-01 ENCOUNTER — Telehealth: Payer: Self-pay | Admitting: Emergency Medicine

## 2016-06-01 NOTE — Telephone Encounter (Signed)
Notes Recorded by Julio Sicksammy S Parrett, NP on 05/26/2016 at 9:08 AM No sign of PNA  Stable chronic changes  Cont w/ ov recs Please contact office for sooner follow up if symptoms do not improve or worsen or seek emergency care   Spoke with pt and notified of results per TP. Pt verbalized understanding and denied any questions.

## 2016-06-01 NOTE — Progress Notes (Signed)
Quick Note:  Pt aware ______ 

## 2016-07-29 ENCOUNTER — Ambulatory Visit (INDEPENDENT_AMBULATORY_CARE_PROVIDER_SITE_OTHER): Payer: Medicare Other | Admitting: Emergency Medicine

## 2016-07-29 ENCOUNTER — Encounter: Payer: Self-pay | Admitting: Emergency Medicine

## 2016-07-29 DIAGNOSIS — R918 Other nonspecific abnormal finding of lung field: Secondary | ICD-10-CM | POA: Diagnosis not present

## 2016-07-29 DIAGNOSIS — Z87891 Personal history of nicotine dependence: Secondary | ICD-10-CM | POA: Diagnosis not present

## 2016-07-29 DIAGNOSIS — J441 Chronic obstructive pulmonary disease with (acute) exacerbation: Secondary | ICD-10-CM | POA: Diagnosis not present

## 2016-07-29 NOTE — Assessment & Plan Note (Signed)
Single exacerbation in June. We discussed exposure and risk especially the fumes from her work environment. Continue Spiriva and albuterol as needed. Flu shot in the fall. She believes that she has had Prevnar before and she will check her primary care physician's records so that we can indicate when this was done.

## 2016-07-29 NOTE — Patient Instructions (Addendum)
Please continue your Spiriva daily Take albuterol 2 puffs up to every 4 hours if needed for shortness of breath.  Get your CT scan as scheduled on 08/21/16.  Get the flu shot.  Find out when you had the Prevnar - 13 vaccine so we can record it in your records.  Follow with Dr Delton CoombesByrum in 12 months or sooner if you have any problems

## 2016-07-29 NOTE — Progress Notes (Signed)
Subjective:    Patient ID: Amber Holt, female    DOB: 1941/05/14, 75y.o.   MRN: [id number]  HPI 75 yo smoker (35 pk-yrs), frequent bronchitis / PNA, allergies. Continues to smoke 1 pk a day. Beginning in 11/13 she has had bouts of bronchitis and apparent COPD exacerbation. When she isn't exacerbated, she does do some wheezing. She does cough. Lately productive, but not at baseline. She has nasal gtt, contributes to cough, uses fluticasone spray. She has had CXR's during the flares that show some RUL scar, stable on serial films, last done 12/13. She is finishing up a course of omnicef.   ROV 03/08/13 -- hx tobacco, presumed COPD and recent AE. Returns for follow up. Her cough is better, but still happens. Phlegm is light in color.  Still has some wheeze. Her PND is better also on fluticasone. Using albuterol 2-3x a day. Still smoking less than 1pk/day.   ROV 04/05/13 -- COPD, mild AFL. Returns after starting Spiriva last month. She hasn't had a cigarette for over a week. For last 3 days had had more nasal congestion, more yellow chest congestion. She likes the spiriva, feels better, more energy. Rare albuterol use. No fevers, but "feels sick".   ROV 07/12/13 -- f/u for COPD.  She has not restarted smoking!  She was treated recently for an acute sinusitis with levofloxacin - better. Remains active although some fatigue. Works 3x a week, does her yard work. Rare albuterol use, happens when she is outside.   08/02/13- 38 yo smoker (35 pk-yrs), followed by Dr Delton Coombes for frequent bronchitis / PNA, allergies. Acute visit- FOLLOW FOR: pt reports feeling like having a side effect from spiriva, feels skin is "crawling and tingling", having chest tightness, bringing up more mucus w bits of yellow, some increased SOB x 1 month   ROV 08/08/13 -- follow up for COPD. We had started [place] and she may have had side effects > tingling in UE, LE. She was also treated for possible bronchitis - got sick with  augmentin, stopped it. She is having UA irritation, sounds like GERD sx.   ROV 10/18/13 -- hx COPD, rhinitis and cough. Following pulm nodules by CT, most recent 10/11/13 > nodules stable. We stopped spiriva for 10 days because there was concern that it could have been causing side effects. She has restarted the Spiriva. She is being treated by a neurologist with steroids for ??. She has been told she doesn't have MS, etc. She has cut back on her caffeine with benefit. She continues to have exertional SOB, also with bending.   ROV 08/22/14 -- followup visit for COPD, rhinitis, chronic cough and primary nodules. Last visit we restarted her Spiriva due to exertional dyspnea. She returns today with fairly stable breathing. Rarely uses albuterol. She is having some UA irritation and am mucous. She is having some GERD sx even on PPI. She is not coughing the rest of the day.   ROV 07/30/15 -- follow up visit for COPD, hx stable pulm nodules Last CT scan performed in 2014 Spiriva and albuterol prn, uses rarely. Concern about the recent increase in cost by 100.00 per month.  Some cough. Rare wheeze.  No flares.   ROV 07/29/16 -- pt with a hx of COPD, pulmonary nodules that were stable pon serial scans, now part of the LDCT screening program, Most recent scan was 07/2015. She will be due for repeat scan this month based on a RADS category 2 scan.  She was  treated for an AE w pred and levaquin. On spiriva qd and albuterol prn > about 1-2x a day. Has trouble with hair spray exposure, pollution.  She is due for her LDCT on 08/21/16. Her exercise tolerance is good. She does have wheeze when she goes to work, is exposed to spray at work. She believes that she had prevnar w her PCP   PULMONARY FUNCTON TEST 03/08/2013  FVC 2.35  FEV1 1.59  FEV1/FVC 67.7  FVC  % Predicted 98  FEV % Predicted 95  FeF 25-75 .84  FeF 25-75 % Predicted 2.08      Objective:   Vitals:   07/29/16 1327 07/29/16 1328  BP:  130/62  BP  Location:  Left Arm  Cuff Size:  Normal  Pulse:  97  SpO2:  93%  Weight: 147 lb (66.7 kg)   Height: 5' (1.524 m)     Gen: Pleasant, well-nourished, in no distress,  normal affect  ENT: No lesions,  mouth clear,  oropharynx clear, no postnasal drip  Neck: No JVD, no TMG, no carotid bruits  Lungs: No use of accessory muscles, no dullness to percussion, clear without rales or rhonchi  Cardiovascular: RRR, heart sounds normal, no murmur or gallops, no peripheral edema  Musculoskeletal: No deformities, no cyanosis or clubbing  Neuro: alert, non focal  Skin: Warm, no lesions or rashes   CT scan 10/11/13 --  COMPARISON: 01/11/2013  FINDINGS:  Aortic and coronary artery atherosclerotic vascular disease noted.  Hepatic steatosis is present.  No pathologic thoracic adenopathy observed. The proximal right  subclavian artery aneurysm, 1.3 cm.  Benign 4 mm subpleural lymph nodes in the left upper lobe along the  major fissure, image 9 of series 3, no change from 2012. Additional  faint nodularity in the left upper lobe is unchanged from 2012 and  considered benign.  3 mm thick pleural-based nodule in the  Scattered small pulmonary nodules in both lungs measure 5 mm or  below, are stable compared through 09/25/2011, and are accordingly  considered benign.  Considerable emphysema noted with scattered fibrosis in the lungs.  IMPRESSION:  1. The small pulmonary nodules in both lungs are stable through 2  years ago and accordingly are considered benign. No further imaging  workup of these nodules is necessary based on current guidelines.  This recommendation follows the consensus statement: Guidelines for  Management of Small Pulmonary Nodules Detected on CT Scans: A  Statement from the Fleischner Society as published in Radiology  2005; 237:395-400.  2. 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  3. Atherosclerosis. 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  4. Hepatic steatosis.       Assessment & Plan:  Pulmonary nodules Stable for over 2 years by serial CT scans. She is now a part of the lung cancer screening program.  History of tobacco use She had a RADS 2 scan one year ago, planning for repeat LDCT  scan on 08/21/16  COPD (chronic obstructive pulmonary disease) Single exacerbation in June. We discussed exposure and risk especially the fumes from her work environment. Continue Spiriva and albuterol as needed. Flu shot in the fall. She believes that she has had Prevnar before and she will check her primary care physician's records so that we can indicate when this was done.   Levy Pupaobert Philemon Riedesel, MD, PhD 07/29/2016, 1:56 PM [facility] [phone] or if no answer [phone]

## 2016-07-29 NOTE — Assessment & Plan Note (Signed)
She had a RADS 2 scan one year ago, planning for repeat LDCT  scan on 08/21/16

## 2016-07-29 NOTE — Assessment & Plan Note (Signed)
Stable for over 2 years by serial CT scans. She is now a part of the lung cancer screening program.

## 2016-08-06 ENCOUNTER — Telehealth: Payer: Self-pay | Admitting: Emergency Medicine

## 2016-08-06 MED ORDER — TIOTROPIUM BROMIDE MONOHYDRATE 18 MCG IN CAPS: 18.0000 ug | ORAL_CAPSULE | Freq: Every day | RESPIRATORY_TRACT | 11 refills | Status: DC

## 2016-08-06 NOTE — Telephone Encounter (Signed)
LMTCB x 1 

## 2016-08-06 NOTE — Telephone Encounter (Signed)
Pt called back. She needed refill on spiriva. Rx sent in. Nothing further needed

## 2016-08-21 ENCOUNTER — Ambulatory Visit (INDEPENDENT_AMBULATORY_CARE_PROVIDER_SITE_OTHER)
Admission: RE | Admit: 2016-08-21 | Discharge: 2016-08-21 | Disposition: A | Discharge status: Home / Self Care | Payer: Medicare Other | Source: Ambulatory Visit | Attending: Acute Care | Admitting: Acute Care

## 2016-08-21 DIAGNOSIS — F17210 Nicotine dependence, cigarettes, uncomplicated: Principal | ICD-10-CM

## 2016-08-21 DIAGNOSIS — Z87891 Personal history of nicotine dependence: Secondary | ICD-10-CM | POA: Diagnosis not present

## 2016-08-26 ENCOUNTER — Telehealth: Payer: Self-pay | Admitting: Acute Care

## 2016-08-26 DIAGNOSIS — F17210 Nicotine dependence, cigarettes, uncomplicated: Principal | ICD-10-CM

## 2016-08-26 NOTE — Telephone Encounter (Signed)
I attempted to call Amber Holt with the results of her low-dose CT screening. The patient did not answer at her home or mobile number. I have left a message requesting that she call the office for her results, in addition to the contact number for the office. We will await her return call.

## 2016-09-07 ENCOUNTER — Telehealth: Payer: Self-pay | Admitting: Acute Care

## 2016-09-07 NOTE — Telephone Encounter (Signed)
I have attempted to call this patient with her low-dose CT screening results twice, on September 6 and again today on the 18th. I have left messages with contact information at both attempts, asking that the patient call the office for the results. I will await her return call.

## 2016-09-09 ENCOUNTER — Telehealth: Payer: Self-pay | Admitting: Acute Care

## 2016-09-09 NOTE — Telephone Encounter (Signed)
Patient states that she received a message from Maralyn SagoSarah advising her that her CT was fine, but she said that Maralyn SagoSarah wanted to speak to her.  She is calling Maralyn SagoSarah back.  Sarah, please advise.

## 2016-09-10 NOTE — Telephone Encounter (Signed)
I returned the call to Ms. Cobbs. There was no answer. I have left my contact number and asked that she call me back if she has any questions regarding her scan results.

## 2016-09-11 ENCOUNTER — Telehealth: Payer: Self-pay | Admitting: Emergency Medicine

## 2016-09-11 MED ORDER — AZITHROMYCIN 250 MG PO TABS: ORAL_TABLET | ORAL | 0 refills | Status: DC

## 2016-09-11 NOTE — Telephone Encounter (Signed)
Spoke with pt, aware of recs.  zpak sent to preferred pharmacy.  Nothing further needed.  

## 2016-09-11 NOTE — Telephone Encounter (Signed)
Definitely keep ov with eye doctor. ? Eye issues.  Try mucinex DM Twice daily As needed  Cough/congestion  Can have Zpack take as directed # 1 for COPD flare  Please contact office for sooner follow up if symptoms do not improve or worsen or seek emergency care  Keep follow up with Dr. Delton CoombesByrum  As planned and As needed

## 2016-09-11 NOTE — Telephone Encounter (Signed)
Patient returned call, asking us to call this number now, [phone].

## 2016-09-11 NOTE — Telephone Encounter (Signed)
Spoke with pt and she states that her left eye has developed a "yellow spot" on it and some discharge and mild swelling. She has an appt with eye dr this coming Monday. She also c/o cough with yellow mucus since early this week. Pt denies wheeze/ShOB/chest tightness or f/n/v. Pt has not taken any OTC medications for symptoms.   Pt would like to know what she should do.   RB is not in office. TP - Please advise. Thanks!

## 2016-09-11 NOTE — Telephone Encounter (Signed)
LMOMTCB x 1 

## 2016-09-21 IMAGING — CT CT CHEST LUNG CANCER SCREENING LOW DOSE W/O CM
2 of 5 series · 15 of 40 positions shown, 18 images · non-contrast
Comparison: 10/11/2013

CLINICAL DATA: Current smoker. Thirty-five pack-year history.
Asymptomatic.

EXAM:
CT CHEST WITHOUT CONTRAST
TECHNIQUE: Multidetector CT imaging of the chest was performed following the
standard protocol without IV contrast.

[Series 5: lungs thins for pacs · axial · 0.60mm/px · z∈[-252,-34]mm · 12 of 240 slices shown, 15 images]
[im 11/240  mediastinal]
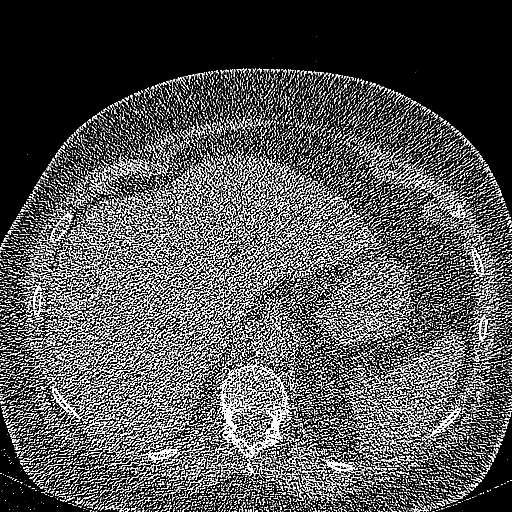
[im 11/240  lung]
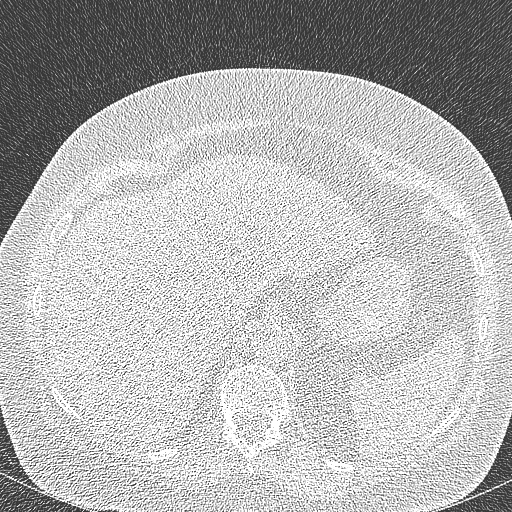
[im 33/240  lung]
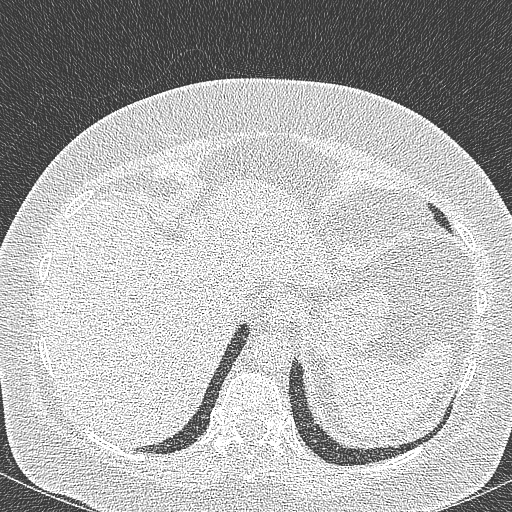
[im 55/240  lung]
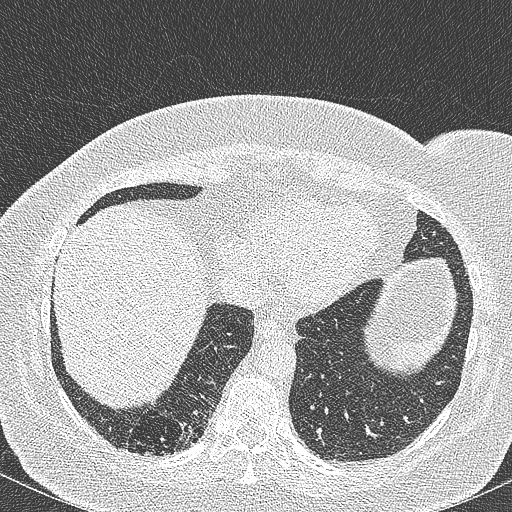
[im 77/240  lung]
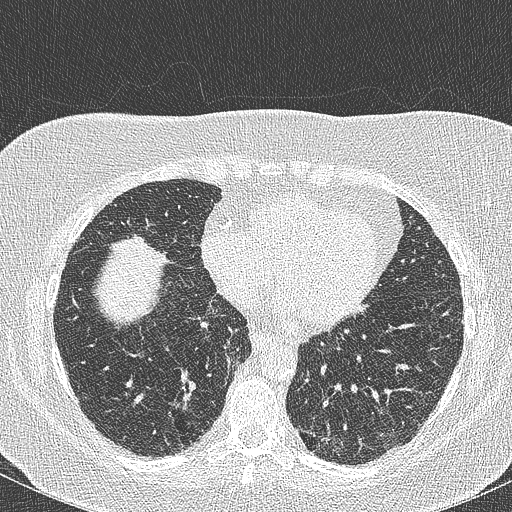
[im 87/240  mediastinal]
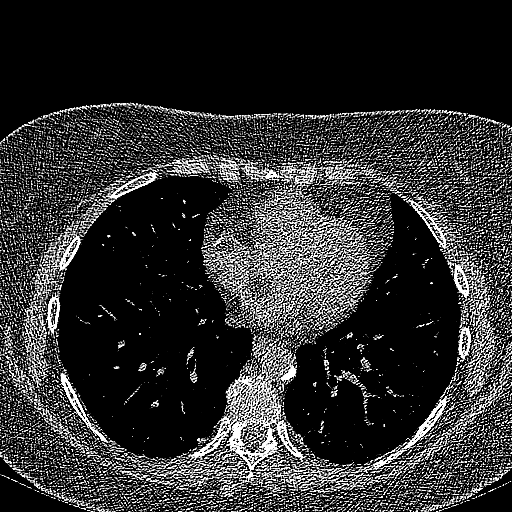
[im 87/240  lung]
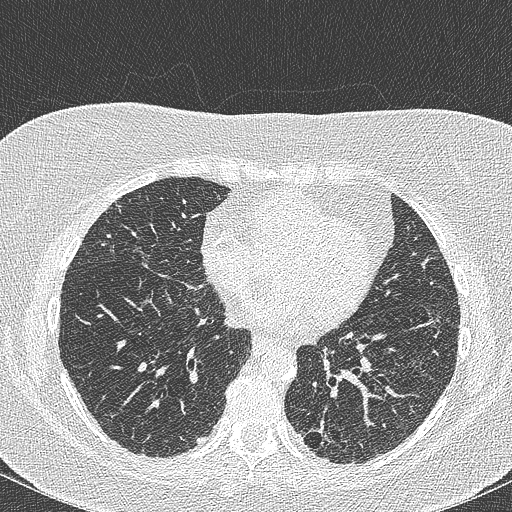
[im 109/240  lung]
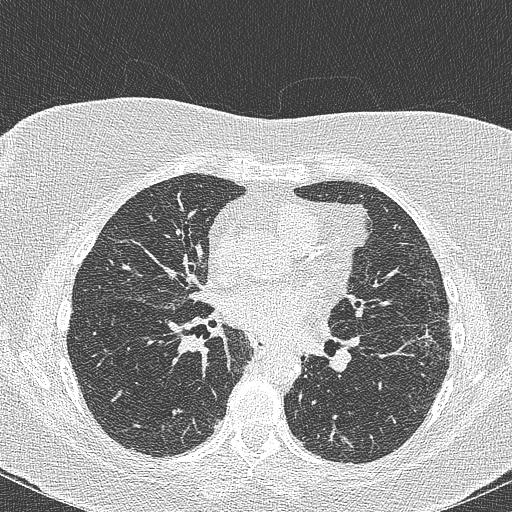
[im 131/240  lung]
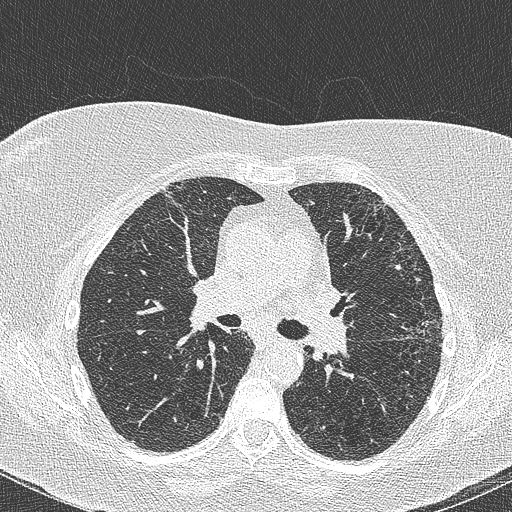
[im 153/240  lung]
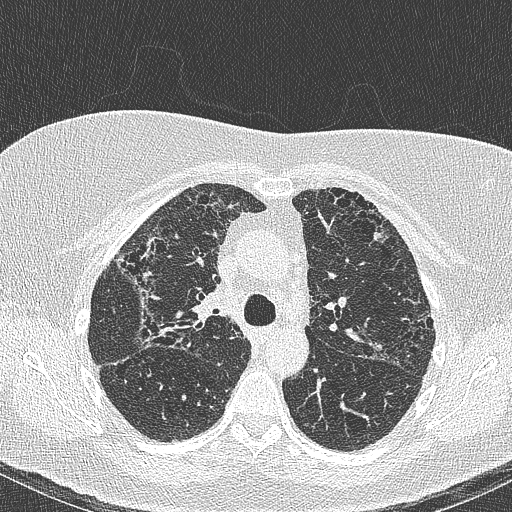
[im 163/240  mediastinal]
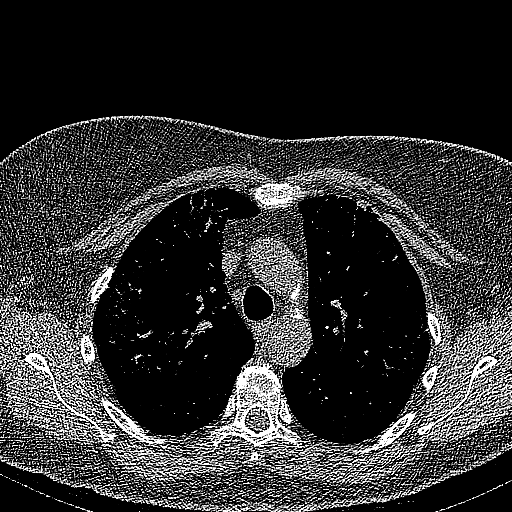
[im 163/240  lung]
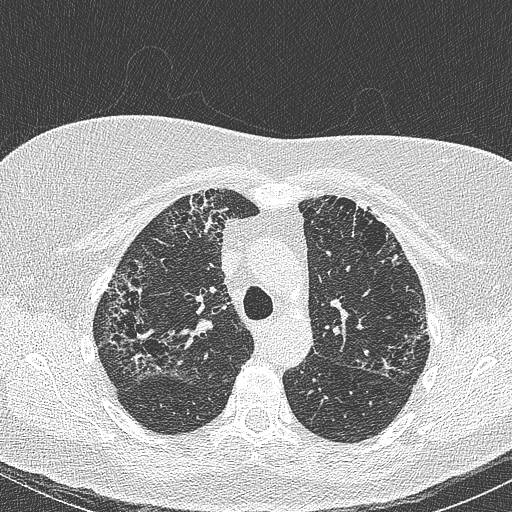
[im 185/240  lung]
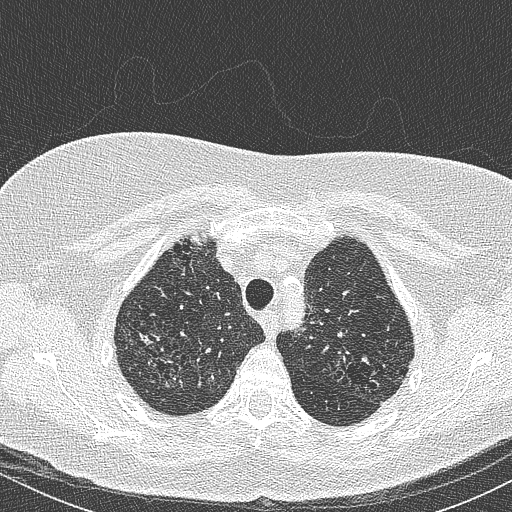
[im 207/240  lung]
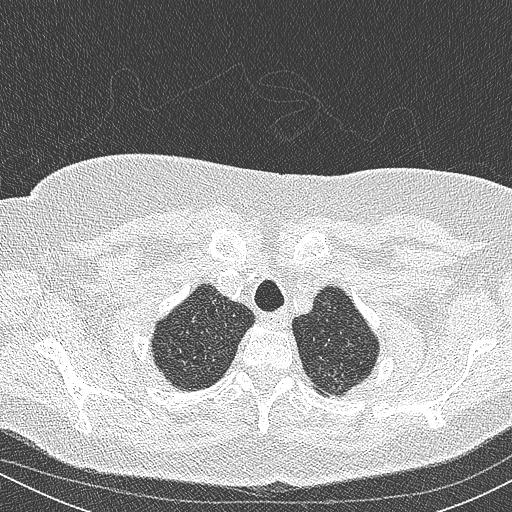
[im 229/240  lung]
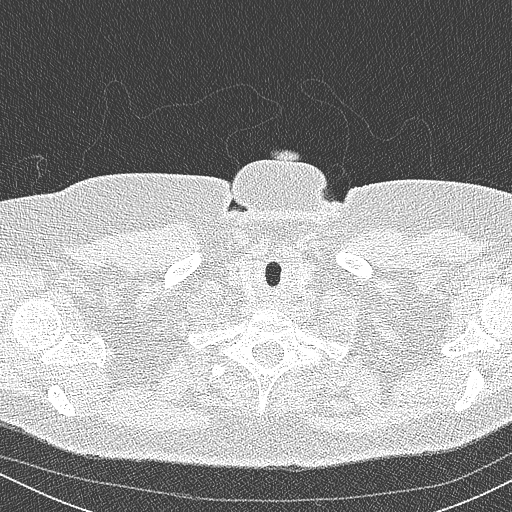

[Series 602: cor · coronal · 0.60mm/px · 3 of 106 slices shown]
[im 22/106  lung]
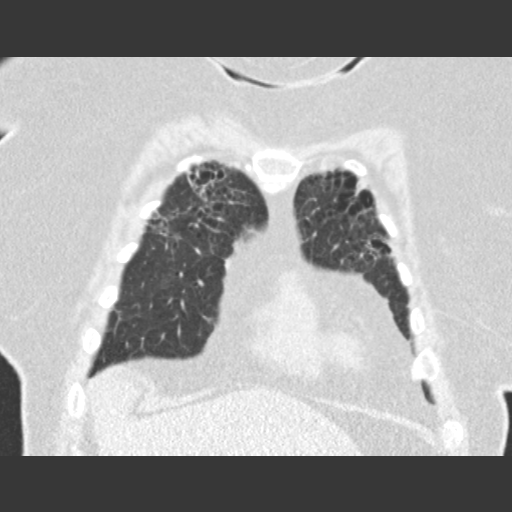
[im 43/106  lung]
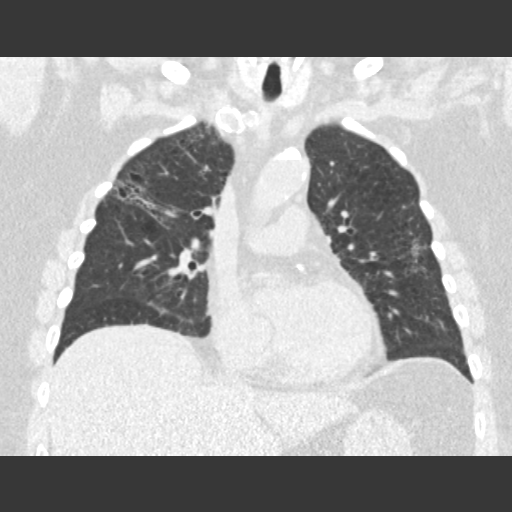
[im 64/106  lung]
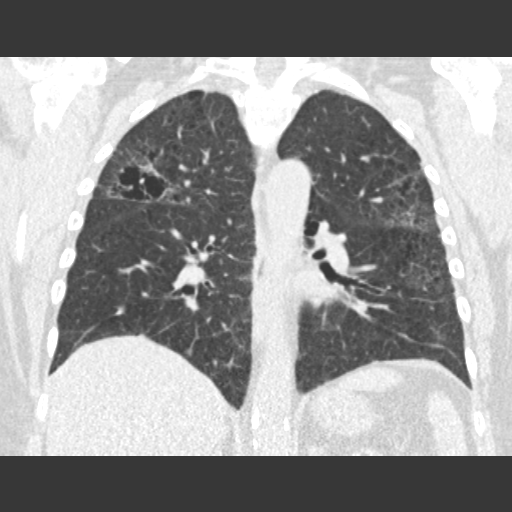

[15 of 40 positions shown; findings below may reference images not displayed]

FINDINGS: Mediastinum: Normal heart size. The trachea is patent and midline.
Normal appearance of the esophagus. Aortic atherosclerosis.
Calcification within the RCA, LAD and left circumflex coronary
artery noted. There is no mediastinal or hilar adenopathy.

Lungs/Pleura: There are moderate changes of centrilobular and
paraseptal emphysema. There is no airspace consolidation identified.
There are multiple small, non pulmonary nodules identified
throughout both lungs. The largest is in the left upper lobe
measuring 4.8 mm, image 50/series 5.

Upper Abdomen: No suspicious liver abnormality identified. The
visualized portions of the spleen and adrenal glands are normal.

Musculoskeletal: Review of the visualized osseous structures is
negative for aggressive lytic or sclerotic bone lesion.
IMPRESSION: 1. Lung-RADS Category 2, benign appearance or behavior. Continue
annual screening with low-dose chest CT without contrast in 12
months
2. Emphysema
3. Aortic atherosclerosis.

## 2016-10-05 ENCOUNTER — Other Ambulatory Visit: Payer: Self-pay | Admitting: Orthopedic Surgery

## 2016-10-05 DIAGNOSIS — M545 Low back pain, unspecified: Secondary | ICD-10-CM

## 2016-10-05 NOTE — Telephone Encounter (Signed)
I have called Mrs. Amber Holt with the results of her low-dose screening CT. I explained to her that her scan was read as a lung RADS 2 indicating nodules that are benign in appearance and behavior. Recommendation is for follow-up annual scan  in 12 months. I have told her that we will schedule and order that scan for October 2018. We also discussed the fact that her scan indicated aortic atherosclerosis and left main and three-vessel coronary artery disease. Her primary care physician has recently checked cholesterol which was high and they are initiating a diet modification program. If diet modification does not decrease her cholesterol they will move on to medical management. Amber Holt verbalized understanding of the above and had no further questions at completion of the call. She has my contact information in the event she has questions in the future.

## 2016-10-12 ENCOUNTER — Ambulatory Visit
Admission: RE | Admit: 2016-10-12 | Discharge: 2016-10-12 | Disposition: A | Discharge status: Home / Self Care | Payer: Medicare Other | Source: Ambulatory Visit | Attending: Orthopedic Surgery | Admitting: Orthopedic Surgery

## 2016-10-12 ENCOUNTER — Telehealth: Payer: Self-pay | Admitting: Emergency Medicine

## 2016-10-12 DIAGNOSIS — M545 Low back pain, unspecified: Secondary | ICD-10-CM

## 2016-10-12 MED ORDER — IOPAMIDOL (ISOVUE-M 200) INJECTION 41%
15.0000 mL | Freq: Once | INTRAMUSCULAR | Status: AC
  Administered 2016-10-12: 15 mL via INTRATHECAL

## 2016-10-12 MED ORDER — ALBUTEROL SULFATE HFA 108 (90 BASE) MCG/ACT IN AERS: 2.0000 | INHALATION_SPRAY | Freq: Four times a day (QID) | RESPIRATORY_TRACT | 5 refills | Status: AC | PRN

## 2016-10-12 NOTE — Progress Notes (Signed)
Pt states she has been off Lexapro for the past 4 days.

## 2016-10-12 NOTE — Telephone Encounter (Signed)
Spoke with pt. She is needing a refill on Albuterol HFA. This has been sent in. Nothing further was needed. 

## 2016-10-12 NOTE — Discharge Instructions (Signed)
Myelogram Discharge Instructions  1. Go home and rest quietly for the next 24 hours.  It is important to lie flat for the next 24 hours.  Get up only to go to the restroom.  You may lie in the bed or on a couch on your back, your stomach, your left side or your right side.  You may have one pillow under your head.  You may have pillows between your knees while you are on your side or under your knees while you are on your back.  2. DO NOT drive today.  Recline the seat as far back as it will go, while still wearing your seat belt, on the way home.  3. You may get up to go to the bathroom as needed.  You may sit up for 10 minutes to eat.  You may resume your normal diet and medications unless otherwise indicated.  Drink plenty of extra fluids today and tomorrow.  4. The incidence of a spinal headache with nausea and/or vomiting is about 5% (one in 20 patients).  If you develop a headache, lie flat and drink plenty of fluids until the headache goes away.  Caffeinated beverages may be helpful.  If you develop severe nausea and vomiting or a headache that does not go away with flat bed rest, call [phone].  5. You may resume normal activities after your 24 hours of bed rest is over; however, do not exert yourself strongly or do any heavy lifting tomorrow.  6. Call your physician for a follow-up appointment.   You may resume Lexapro on Tuesday, October 13, 2016 after 1:30p.m.

## 2016-10-15 ENCOUNTER — Telehealth: Payer: Self-pay

## 2016-10-15 NOTE — Telephone Encounter (Signed)
LMOM asking how she is feeling after her myelogram here on 10/12/16.  jkl

## 2016-10-26 ENCOUNTER — Other Ambulatory Visit: Payer: Self-pay | Admitting: Nurse Practitioner

## 2016-10-26 DIAGNOSIS — Z1231 Encounter for screening mammogram for malignant neoplasm of breast: Secondary | ICD-10-CM

## 2016-11-04 ENCOUNTER — Telehealth: Payer: Self-pay | Admitting: Emergency Medicine

## 2016-11-04 NOTE — Telephone Encounter (Signed)
Called and spoke to pt. Pt c/o nonprod cough and sinus congestion. Appt made with TP on 11.17.17. Pt verbalized understanding and denied any further questions or concerns at this time.

## 2016-11-06 ENCOUNTER — Ambulatory Visit (INDEPENDENT_AMBULATORY_CARE_PROVIDER_SITE_OTHER): Payer: Medicare Other | Admitting: Adult Health

## 2016-11-06 ENCOUNTER — Encounter: Payer: Self-pay | Admitting: Adult Health

## 2016-11-06 DIAGNOSIS — J441 Chronic obstructive pulmonary disease with (acute) exacerbation: Secondary | ICD-10-CM | POA: Diagnosis not present

## 2016-11-06 MED ORDER — DOXYCYCLINE HYCLATE 100 MG PO TABS
100.0000 mg | ORAL_TABLET | Freq: Two times a day (BID) | ORAL | 0 refills | Status: DC
Start: 2016-11-06 — End: 2017-03-08

## 2016-11-06 MED ORDER — TIOTROPIUM BROMIDE MONOHYDRATE 2.5 MCG/ACT IN AERS: 2.0000 | INHALATION_SPRAY | Freq: Every day | RESPIRATORY_TRACT | 0 refills | Status: DC

## 2016-11-06 NOTE — Progress Notes (Signed)
Subjective:    Patient ID: Amber Holt, female    DOB: [birth year], 75 y.o.   MRN: [id number]  HPI  75 year old smoker with COPD and pulmonary nodules (stable since 2014)    11/06/2016 Acute OV : COPD  Patient presents for an acute office visit Patient complains of prod cough yellow mucus , drainage and dyspnea  For few days . Does not want to be sick for the holidays.  Denies chest pain, orthopnea, edema , fever or hemoptysis  Remains on Spiriva daily   Still smoking , discussed cessation .     Past Medical History:  Diagnosis Date  . Allergic rhinitis   . Asthma    Current Outpatient Prescriptions on File Prior to Visit  Medication Sig Dispense Refill  . albuterol (PROVENTIL HFA;VENTOLIN HFA) 108 (90 Base) MCG/ACT inhaler Inhale 2 puffs into the lungs every 6 (six) hours as needed. 1 Inhaler 5  . escitalopram (LEXAPRO) 10 MG tablet Take 20 mg by mouth daily.     . fluticasone (FLONASE) 50 MCG/ACT nasal spray Place 1 spray into the nose daily.    . Levothyroxine Sodium (SYNTHROID PO) Take 0.75 mcg by mouth.     Marland Kitchen. LORazepam (ATIVAN) 0.5 MG tablet Take 0.5 mg by mouth 2 (two) times daily as needed for anxiety.    . Losartan Potassium (COZAAR PO) Take 100 mg by mouth.     . METFORMIN HCL PO Take 500 mg by mouth 2 (two) times daily. Reported on 05/25/2016    . montelukast (SINGULAIR) 10 MG tablet Take 10 mg by mouth at bedtime.    . pseudoephedrine-guaifenesin (MUCINEX D) 60-600 MG 12 hr tablet Take 1 tablet by mouth every 12 (twelve) hours.    Marland Kitchen. tiotropium (SPIRIVA) 18 MCG inhalation capsule Place 1 capsule (18 mcg total) into inhaler and inhale daily. 30 capsule 11  . loratadine (CLARITIN) 10 MG tablet Take 10 mg by mouth daily.     No current facility-administered medications on file prior to visit.     Review of Systems Constitutional:   No  weight loss, night sweats,  Fevers, chills, fatigue, or  lassitude.  HEENT:   No headaches,  Difficulty swallowing,   Tooth/dental problems, or  Sore throat,                No sneezing, itching, ear ache,  +nasal congestion, post nasal drip,   CV:  No chest pain,  Orthopnea, PND, swelling in lower extremities, anasarca, dizziness, palpitations, syncope.   GI  No heartburn, indigestion, abdominal pain, nausea, vomiting, diarrhea, change in bowel habits, loss of appetite, bloody stools.   Resp:   No chest wall deformity  Skin: no rash or lesions.  GU: no dysuria, change in color of urine, no urgency or frequency.  No flank pain, no hematuria   MS:  No joint pain or swelling.  No decreased range of motion.  No back pain.  Psych:  No change in mood or affect. No depression or anxiety.  No memory loss.         Objective:   Physical Exam  Vitals:   11/06/16 1435  BP: 124/60  Pulse: 88  Temp: 98.1 F (36.7 C)  TempSrc: Oral  SpO2: 96%  Weight: 147 lb (66.7 kg)  Height: 5' (1.524 m)   GEN: A/Ox3; pleasant , NAD, well nourished    HEENT:  [place]/AT,  EACs-clear, TMs-wnl, NOSE-clear, THROAT-clear, no lesions, no postnasal drip or exudate noted.   NECK:  Supple w/ fair ROM; no JVD; normal carotid impulses w/o bruits; no thyromegaly or nodules palpated; no lymphadenopathy.    RESP  Clear  P & A; w/o, wheezes/ rales/ or rhonchi. no accessory muscle use, no dullness to percussion  CARD:  RRR, no m/r/g  , no peripheral edema, pulses intact, no cyanosis or clubbing.  GI:   Soft & nt; nml bowel sounds; no organomegaly or masses detected.   Musco: Warm bil, no deformities or joint swelling noted.   Neuro: alert, no focal deficits noted.    Skin: Warm, no lesions or rashes  Vandy Fong NP-C  [facility]  11/06/2016       Assessment & Plan:

## 2016-11-06 NOTE — Assessment & Plan Note (Signed)
Flare   Plan  Patient Instructions  Doxycycline 100mg . Twice daily  For 7 days , take with food.  Mucinex DM Twice daily  As needed  Cough/congestion  Please contact office for sooner follow up if symptoms do not improve or worsen or seek emergency care   Follow up Dr. Delton CoombesByrum in 6 months and As needed

## 2016-11-06 NOTE — Patient Instructions (Signed)
Doxycycline 100mg . Twice daily  For 7 days , take with food.  Mucinex DM Twice daily  As needed  Cough/congestion  Please contact office for sooner follow up if symptoms do not improve or worsen or seek emergency care   Follow up Dr. Delton CoombesByrum in 6 months and As needed

## 2016-12-07 ENCOUNTER — Ambulatory Visit: Payer: Medicare Other

## 2016-12-17 ENCOUNTER — Telehealth: Payer: Self-pay | Admitting: Emergency Medicine

## 2016-12-17 MED ORDER — DOXYCYCLINE HYCLATE 100 MG PO TABS: 100.0000 mg | ORAL_TABLET | Freq: Two times a day (BID) | ORAL | 0 refills | Status: DC

## 2016-12-17 NOTE — Telephone Encounter (Signed)
Will hold in triage to call after one per pt request.

## 2016-12-17 NOTE — Telephone Encounter (Signed)
Spoke with pt, who states she feels that she may have bronchitis. Pt c/o prod cough with yellow to brown mucus, wheezing, nasal drainage & chest congestion X 2d. Pt denies any fever, chills or sweats.  Pt doing ventolin, spirivia, flonase and mucinex bid with no improvement.   CY please advise in RB absences. Thanks.

## 2016-12-17 NOTE — Telephone Encounter (Signed)
Offer doxycycline 100 mg, # 14, 1 twice daily 

## 2016-12-17 NOTE — Telephone Encounter (Signed)
Rx sent to preferred pharmacy. Pt aware and voiced her understanding. Nothing further needed.  

## 2016-12-29 DIAGNOSIS — R5383 Other fatigue: Secondary | ICD-10-CM | POA: Diagnosis not present

## 2016-12-29 DIAGNOSIS — E0801 Diabetes mellitus due to underlying condition with hyperosmolarity with coma: Secondary | ICD-10-CM | POA: Diagnosis not present

## 2016-12-29 DIAGNOSIS — I10 Essential (primary) hypertension: Secondary | ICD-10-CM | POA: Diagnosis not present

## 2016-12-29 DIAGNOSIS — F419 Anxiety disorder, unspecified: Secondary | ICD-10-CM | POA: Diagnosis not present

## 2016-12-29 DIAGNOSIS — E079 Disorder of thyroid, unspecified: Secondary | ICD-10-CM | POA: Diagnosis not present

## 2016-12-29 DIAGNOSIS — E039 Hypothyroidism, unspecified: Secondary | ICD-10-CM | POA: Diagnosis not present

## 2016-12-29 DIAGNOSIS — Z6828 Body mass index (BMI) 28.0-28.9, adult: Secondary | ICD-10-CM | POA: Diagnosis not present

## 2017-01-01 ENCOUNTER — Ambulatory Visit: Payer: Medicare Other

## 2017-01-08 ENCOUNTER — Telehealth: Payer: Self-pay | Admitting: Emergency Medicine

## 2017-01-08 NOTE — Telephone Encounter (Signed)
lmomtcb x1 

## 2017-01-08 NOTE — Telephone Encounter (Signed)
[phone] pt calling back

## 2017-01-08 NOTE — Telephone Encounter (Signed)
Attempted to call pt. Received a fast busy signal x2. Will try back. 

## 2017-01-11 NOTE — Telephone Encounter (Signed)
lmomtcb x 3  

## 2017-01-13 NOTE — Telephone Encounter (Signed)
lmomtcb x 3  

## 2017-01-14 NOTE — Telephone Encounter (Signed)
lmomtcb x 2  

## 2017-01-18 NOTE — Telephone Encounter (Signed)
Left message informing pt will close message per office protocol and a new message will be generated when she calls the office back  Will sign off

## 2017-01-19 ENCOUNTER — Ambulatory Visit
Admission: RE | Admit: 2017-01-19 | Discharge: 2017-01-19 | Disposition: A | Discharge status: Home / Self Care | Payer: Medicare Other | Source: Ambulatory Visit | Attending: Nurse Practitioner | Admitting: Nurse Practitioner

## 2017-01-19 DIAGNOSIS — Z1231 Encounter for screening mammogram for malignant neoplasm of breast: Secondary | ICD-10-CM

## 2017-02-02 ENCOUNTER — Telehealth: Payer: Self-pay | Admitting: Emergency Medicine

## 2017-02-02 MED ORDER — LEVOFLOXACIN 500 MG PO TABS: 500.0000 mg | ORAL_TABLET | Freq: Every day | ORAL | 0 refills | Status: DC

## 2017-02-02 NOTE — Telephone Encounter (Signed)
Pt c/o sinus congestion, PND, chest congestion, prod cough with green/yellow mucus, fatigue X2-3 days.   Denies fever, chills, nausea.   Pt taking mucinex, singulair, alka-seltzer to help with symptoms.  Pt uses [facility].   Sending to DOD as RB is unavailable- worked night last night. SN please advise on recs.  Thanks!

## 2017-02-02 NOTE — Telephone Encounter (Signed)
Pt aware of SN's recommendations & voiced her understanding. Rx sent to preferred pharmacy. Nothing further needed.  

## 2017-02-02 NOTE — Telephone Encounter (Signed)
Per SN---  1.  Call in levaquin 500 mg  #7 1 daily   Attempted to call pt but no VM and no answer.  Will try back later.

## 2017-02-08 DIAGNOSIS — Z6828 Body mass index (BMI) 28.0-28.9, adult: Secondary | ICD-10-CM | POA: Diagnosis not present

## 2017-02-08 DIAGNOSIS — H6982 Other specified disorders of Eustachian tube, left ear: Secondary | ICD-10-CM | POA: Diagnosis not present

## 2017-02-09 DIAGNOSIS — H524 Presbyopia: Secondary | ICD-10-CM | POA: Diagnosis not present

## 2017-02-09 DIAGNOSIS — Z961 Presence of intraocular lens: Secondary | ICD-10-CM | POA: Diagnosis not present

## 2017-02-09 DIAGNOSIS — H26491 Other secondary cataract, right eye: Secondary | ICD-10-CM | POA: Diagnosis not present

## 2017-02-22 DIAGNOSIS — E119 Type 2 diabetes mellitus without complications: Secondary | ICD-10-CM | POA: Diagnosis not present

## 2017-02-22 DIAGNOSIS — Z1211 Encounter for screening for malignant neoplasm of colon: Secondary | ICD-10-CM | POA: Diagnosis not present

## 2017-02-22 DIAGNOSIS — J449 Chronic obstructive pulmonary disease, unspecified: Secondary | ICD-10-CM | POA: Diagnosis not present

## 2017-03-04 DIAGNOSIS — J449 Chronic obstructive pulmonary disease, unspecified: Secondary | ICD-10-CM | POA: Diagnosis not present

## 2017-03-06 ENCOUNTER — Telehealth: Payer: Self-pay | Admitting: Pulmonary Disease

## 2017-03-06 NOTE — Telephone Encounter (Signed)
Called by patient's son, Leotis ShamesJeffery, that his mother has had several episodes of blood tinged sputum today. She was seen in urgent care 2 days ago and diagnosed with Tamiflu and a "steroid shot". He is not with mother at this time. Review of her medication list revealed no anticoagulants. It is very difficult to diagnosis the patient over the telephone especially through a third party. Recommended that if the hemoptysis get worse or persist, that she should come to the Emergency Department for further evaluation and possible admission. If she should have no further episodes of hemoptysis, recommended that she call the office Monday morning for a follow up appointment.

## 2017-03-07 ENCOUNTER — Telehealth: Payer: Self-pay | Admitting: Internal Medicine

## 2017-03-07 NOTE — Telephone Encounter (Signed)
On call- Went to UC 4 days ago, dx'd flu and given Tamiflu and Levaquin. Starting yesterday began small streaks heme nose and chest. Otherwise feeling better. Not on ASA or anticoagulants. No other bleeding. Plan- continue current meds. Call office tomorrow for appointment with Dr Delton CoombesByrum.

## 2017-03-08 ENCOUNTER — Encounter: Payer: Self-pay | Admitting: Internal Medicine

## 2017-03-08 ENCOUNTER — Ambulatory Visit (INDEPENDENT_AMBULATORY_CARE_PROVIDER_SITE_OTHER): Payer: Medicare Other | Admitting: Internal Medicine

## 2017-03-08 ENCOUNTER — Ambulatory Visit (INDEPENDENT_AMBULATORY_CARE_PROVIDER_SITE_OTHER)
Admission: RE | Admit: 2017-03-08 | Discharge: 2017-03-08 | Disposition: A | Discharge status: Home / Self Care | Payer: Medicare Other | Source: Ambulatory Visit | Attending: Internal Medicine | Admitting: Internal Medicine

## 2017-03-08 VITALS — BP 150/70 | HR 100 | Temp 98.1°F | Ht 60.0 in | Wt 147.0 lb

## 2017-03-08 DIAGNOSIS — F17210 Nicotine dependence, cigarettes, uncomplicated: Secondary | ICD-10-CM

## 2017-03-08 DIAGNOSIS — J449 Chronic obstructive pulmonary disease, unspecified: Secondary | ICD-10-CM

## 2017-03-08 DIAGNOSIS — R05 Cough: Secondary | ICD-10-CM | POA: Diagnosis not present

## 2017-03-08 MED ORDER — ACETAMINOPHEN-CODEINE #3 300-30 MG PO TABS: ORAL_TABLET | ORAL | 0 refills | Status: AC

## 2017-03-08 NOTE — Progress Notes (Signed)
Spoke with pt and notified of results per Dr. Wert. Pt verbalized understanding and denied any questions. 

## 2017-03-08 NOTE — Assessment & Plan Note (Signed)
DDX of  difficult airways management almost all start with A and  include Adherence, Ace Inhibitors, Acid Reflux, Active Sinus Disease, Alpha 1 Antitripsin deficiency, Anxiety masquerading as Airways dz,  ABPA,  Allergy(esp in young), Aspiration (esp in elderly), Adverse effects of meds,  Active smokers, A bunch of PE's (a small clot burden can't cause this syndrome unless there is already severe underlying pulm or vascular dz with poor reserve) plus two Bs  = Bronchiectasis and Beta blocker use..and one C= CHF  Adherence is always the initial "prime suspect" and is a multilayered concern that requires a "trust but verify" approach in every patient - starting with knowing how to use medications, especially inhalers, correctly, keeping up with refills and understanding the fundamental difference between maintenance and prns vs those medications only taken for a very short course and then stopped and not refilled.  - - The proper method of use, as well as anticipated side effects, of a metered-dose inhaler are discussed and demonstrated to the patient. Improved effectiveness after extensive coaching during this visit to a level of approximately 75 % from a baseline of 50 % > continue hfa      Active smoking (see separate a/p)   ? Allergy > continue singulair but no role for systemic steroids at present   ? Active sinusitis/ rhinitis > finish levaquin   ? Anxiety > usually at the bottom of this list of usual suspects but should be much higher on this pt's based on H and P and note already on psychotropics .    ? Bronchiectasis > suggested by cxr but not most recent ct chest so may be overcall but treatment is the same  I had an extended discussion with the patient reviewing all relevant studies completed to date and  lasting 25 minutes of a 40  minute acute offic visit for pt not previously known to me with      non-specific but potentially very serious refractory respiratory symptoms of unknown  etiology.  Each maintenance medication was reviewed in detail including most importantly the difference between maintenance and prns and under what circumstances the prns are to be triggered using an action plan format that is not reflected in the computer generated alphabetically organized AVS.    Please see AVS for specific instructions unique to this office visit that I personally wrote and verbalized to the the pt in detail and then reviewed with pt  by my nurse highlighting any changes in therapy/plan of care  recommended at today's visit.

## 2017-03-08 NOTE — Assessment & Plan Note (Signed)

## 2017-03-08 NOTE — Patient Instructions (Addendum)
For cough mucinex dm  Up to 1200 mg every 12 hours  And if still coughing try tylenol #3 one every 4 hours if needed   Only use your albuterol as a rescue medication to be used if you can't catch your breath by resting or doing a relaxed purse lip breathing pattern.  - The less you use it, the better it will work when you need it. - Ok to use up to 2 puffs  every 4 hours if you must but call for immediate appointment if use goes up over your usual need - Don't leave home without it !!  (think of it like the spare tire for your car)   Work on inhaler technique:  relax and gently blow all the way out then take a nice smooth deep breath back in, triggering the inhaler at same time you start breathing in.  Hold for up to 5 seconds if you can. Blow out thru nose. Rinse and gargle with water when done     Please remember to go to the   x-ray department downstairs in the basement  for your tests - we will call you with the results when they are available.  Keep up the good work on not inhaling cigarettes, it's the most improtant aspect of your care   Call for follow up if not back to normal in a week

## 2017-03-08 NOTE — Progress Notes (Signed)
Subjective:    Patient ID: Amber Holt, female    DOB: 04-Jul-1941, 76 y.o.   MRN: [id number]  HPI  76 year old smoker with COPD GOLD I and pulmonary nodules (stable since 2014)    11/06/2016 Acute OV : COPD  Patient presents for an acute office visit Patient complains of prod cough yellow mucus , drainage and dyspnea  For few days . Does not want to be sick for the holidays.  Denies chest pain, orthopnea, edema , fever or hemoptysis  Remains on Spiriva daily  Still smoking , discussed cessation .  rec Doxycycline 100mg . Twice daily  For 7 days , take with food.  Mucinex DM Twice daily  As needed  Cough/congestion       03/08/2017 acute extended ov/Jolie Strohecker re:  Copd GOLD I / exac on spiriva and rare saba at baseline Chief Complaint  Patient presents with  . Acute Visit    Pt states dxed with flu on 03/04/17. She c/o aches and cough with grey to brown sputum. She states her sputum is occ blood tinged.     All better p last ov with good activity tol then acute onset fever aching all over 03/04/17 congested cough with creamy grey then brown by 02/06/17  Pm 03/04/17 [facility] no cxr flu test neg > tamiflu and levaquin and phenergan and slt better but still nausea/achy congested cough and traces of epistaxis, streaks of blood in sputum Not using proair since onset but maybe once a day Still smoking but says doesn't inhale them much anymore   No obvious day to day or daytime variability or assoc   mucus plugs or significant  hemoptysis or cp or chest tightness, subjective wheeze or overt sinus or hb symptoms. No unusual exp hx or h/o childhood pna/ asthma or knowledge of premature birth.  Sleeping ok without nocturnal  or early am exacerbation  of respiratory  c/o's or need for noct saba. Also denies any obvious fluctuation of symptoms with weather or environmental changes or other aggravating or alleviating factors except as outlined above   Current Medications, Allergies,  Complete Past Medical History, Past Surgical History, Family History, and Social History were reviewed in [facility] electronic medical record.  ROS  The following are not active complaints unless bolded sore throat, dysphagia, dental problems, itching, sneezing,  nasal congestion or excess/ purulent secretions, ear ache,   fever, chills, sweats, unintended wt loss, classically pleuritic or exertional cp,  orthopnea pnd or leg swelling, presyncope, palpitations, abdominal pain, anorexia, nausea, vomiting, diarrhea  or change in bowel or bladder habits, change in stools or urine, dysuria,hematuria,  rash, arthralgias, visual complaints, headache, numbness, weakness or ataxia or problems with walking or coordination,  change in mood/affect or memory.             Objective:   Physical Exam   Wt Readings from Last 3 Encounters:  03/08/17 147 lb (66.7 kg)  11/06/16 147 lb (66.7 kg)  07/29/16 147 lb (66.7 kg)    Vital signs reviewed  - Note on arrival 02 sats  94% on RA      GEN: A/Ox3; pleasant , NAD, well nourished    HEENT:  [place]/AT,  EACs-clear, TMs-wnl  THROAT-clear, no lesions, no postnasal drip or exudate noted. - moderate bilateral non-specific turbinate edema  / crusting with some bloody erosions also   NECK:  Supple w/ fair ROM; no JVD; normal carotid impulses w/o bruits; no thyromegaly or nodules palpated; no lymphadenopathy.  RESP  Clear  P & A; w/o, minimal insp and exp rhonchi bilaterally   CARD:  RRR, no m/r/g  , no peripheral edema, pulses intact, no cyanosis or clubbing.  GI:   Soft & nt; nml bowel sounds; no organomegaly or masses detected.   Musco: Warm bil, no deformities or joint swelling noted.   Neuro: alert, no focal deficits noted.    Skin: Warm, no lesions or rashes    CXR PA and Lateral:   03/08/2017 :    I personally reviewed images and agree with radiology impression as follows:    Stable scarring in right upper lobe. Bronchiectasis bilateral  lower lobe again noted. Stable peripheral fibrotic changes bilaterally. No definite superimposed infiltrate or pulmonary edema.     Assessment & Plan:

## 2017-04-22 ENCOUNTER — Telehealth: Payer: Self-pay | Admitting: Emergency Medicine

## 2017-04-22 MED ORDER — AZITHROMYCIN 250 MG PO TABS: ORAL_TABLET | ORAL | 0 refills | Status: DC

## 2017-04-22 MED ORDER — PREDNISONE 10 MG PO TABS: ORAL_TABLET | ORAL | 0 refills | Status: DC

## 2017-04-22 NOTE — Telephone Encounter (Signed)
Pt c/o prod cough with green/yellow mucus, sinus congestion, PND, itchy eyes X5 days.  Pt denies fever, CP, chills, nauesa, vomiting.    Pt taking mucinex and claritin- requesting further recs.   [facility] uses [facility]   RB please advise on further recs.  Thanks!

## 2017-04-22 NOTE — Telephone Encounter (Signed)
Make sure she is taking her flonase as well Treat w azithro > Z pack pred taper >> Take 30mg  daily for 3 days, then 20mg  daily for 3 days, then 10mg  daily for 3 days, then stop  Call us if not improving, would need to see her in office

## 2017-04-22 NOTE — Telephone Encounter (Signed)
Pt aware of recs.  rx's sent to preferred pharmacy.  Nothing further needed.  

## 2017-04-22 NOTE — Telephone Encounter (Signed)
Patient returned phone call.Amber Holt ° °

## 2017-04-22 NOTE — Telephone Encounter (Signed)
lmtcb X1 for pt  

## 2017-04-27 DIAGNOSIS — E039 Hypothyroidism, unspecified: Secondary | ICD-10-CM | POA: Diagnosis not present

## 2017-05-05 ENCOUNTER — Ambulatory Visit: Payer: Medicare Other | Admitting: Emergency Medicine

## 2017-05-06 ENCOUNTER — Encounter: Payer: Self-pay | Admitting: Adult Health

## 2017-05-06 ENCOUNTER — Ambulatory Visit (INDEPENDENT_AMBULATORY_CARE_PROVIDER_SITE_OTHER): Payer: Medicare Other | Admitting: Adult Health

## 2017-05-06 DIAGNOSIS — J309 Allergic rhinitis, unspecified: Secondary | ICD-10-CM | POA: Insufficient documentation

## 2017-05-06 DIAGNOSIS — J449 Chronic obstructive pulmonary disease, unspecified: Secondary | ICD-10-CM | POA: Diagnosis not present

## 2017-05-06 DIAGNOSIS — J301 Allergic rhinitis due to pollen: Secondary | ICD-10-CM | POA: Diagnosis not present

## 2017-05-06 NOTE — Progress Notes (Signed)
 @Patient  ID: Amber Holt, female    DOB: Oct 17, 1941, 76 y.o.   MRN: [id number]  Chief Complaint  Patient presents with  . Follow-up    COPD    Referring provider: Lorre MunroeBaity, Regina W, NP  HPI: [id number] year old female, active smoker followed for COPD Gold 1  TEST /Events  March 2014 FEV1 97%, ratio 69, no significant pocket inhaler response, FVC 100%, DLCO 54% CT chest 08/2016 Moderate emphysema , mild diffuse bronchial wall thickening .  PVX 2009     05/06/2017 Follow up ; COPD  Patient returns for a two-month follow-up for COPD. Patient says overall her breathing is okay w/ no flare of cough or wheezing.  Remains on Spiriva daily. Patient does have allergic rhinitis on Singulair and Claritin and Flonase. Patient continues to smoke, advised on smoking cessation.  Works part time ., hair dresser.     Allergies  Allergen Reactions  . Augmentin [Amoxicillin-Pot Clavulanate] Diarrhea, Nausea And Vomiting and Other (See Comments)    Headache, dry heaves  . Sulfa Antibiotics Swelling    Swelling in face and tongue    Immunization History  Administered Date(s) Administered  . Influenza Whole 10/03/2012  . Influenza,inj,Quad PF,36+ Mos 08/26/2015  . Influenza-Unspecified 11/20/2013, 08/21/2014, 09/21/2016  . Pneumococcal Polysaccharide-23 12/22/2007    Past Medical History:  Diagnosis Date  . Allergic rhinitis   . Asthma     Tobacco History: History  Smoking Status  . Current Every Day Smoker  . Packs/day: 0.50  . Years: 35.00  . Types: Cigarettes  Smokeless Tobacco  . Never Used    Comment: 5-6 cigs per day   Ready to quit: Not Answered Counseling given: Yes   Outpatient Encounter Prescriptions as of 05/06/2017  Medication Sig  . acetaminophen-codeine (TYLENOL #3) 300-30 MG tablet One every 4 hours as needed for cough  . albuterol (PROVENTIL HFA;VENTOLIN HFA) 108 (90 Base) MCG/ACT inhaler Inhale 2 puffs into the lungs every 6 (six) hours as needed.  Marland Kitchen.  escitalopram (LEXAPRO) 10 MG tablet Take 10 mg by mouth daily.   . fluticasone (FLONASE) 50 MCG/ACT nasal spray Place 1 spray into the nose daily.  . Levothyroxine Sodium (SYNTHROID PO) Take 0.75 mcg by mouth.   . loratadine (CLARITIN) 10 MG tablet Take 10 mg by mouth daily.  Marland Kitchen. LORazepam (ATIVAN) 0.5 MG tablet Take 0.5 mg by mouth 2 (two) times daily as needed for anxiety.  . METFORMIN HCL PO Take 500 mg by mouth 2 (two) times daily. Reported on 05/25/2016  . montelukast (SINGULAIR) 10 MG tablet Take 10 mg by mouth at bedtime.  . naproxen sodium (ANAPROX) 220 MG tablet Take daily as needed  . pseudoephedrine-guaifenesin (MUCINEX D) 60-600 MG 12 hr tablet Take 1 tablet by mouth every 12 (twelve) hours.  . Tiotropium Bromide Monohydrate (SPIRIVA RESPIMAT) 2.5 MCG/ACT AERS Inhale 2 puffs into the lungs daily.  . [DISCONTINUED] azithromycin (ZITHROMAX Z-PAK) 250 MG tablet Take 2 tabs today, then 1 daily until gone. (Patient not taking: Reported on 05/06/2017)  . [DISCONTINUED] levofloxacin (LEVAQUIN) 500 MG tablet Take 1 tablet (500 mg total) by mouth daily. (Patient not taking: Reported on 05/06/2017)  . [DISCONTINUED] oseltamivir (TAMIFLU) 75 MG capsule Take 75 mg by mouth 2 (two) times daily.  . [DISCONTINUED] predniSONE (DELTASONE) 10 MG tablet 30mg  X3 days, 20mg  X3 days, 10mg  X3 days. (Patient not taking: Reported on 05/06/2017)   No facility-administered encounter medications on file as of 05/06/2017.      Review of Systems  Constitutional:   No  weight loss, night sweats,  Fevers, chills,  +fatigue, or  lassitude.  HEENT:   No headaches,  Difficulty swallowing,  Tooth/dental problems, or  Sore throat,                No sneezing, itching, ear ache,  +nasal congestion, post nasal drip,   CV:  No chest pain,  Orthopnea, PND, swelling in lower extremities, anasarca, dizziness, palpitations, syncope.   GI  No heartburn, indigestion, abdominal pain, nausea, vomiting, diarrhea, change in bowel  habits, loss of appetite, bloody stools.   Resp: No shortness of breath with exertion or at rest.  No excess mucus, no productive cough,  No non-productive cough,  No coughing up of blood.  No change in color of mucus.  No wheezing.  No chest wall deformity  Skin: no rash or lesions.  GU: no dysuria, change in color of urine, no urgency or frequency.  No flank pain, no hematuria   MS:  No joint pain or swelling.  No decreased range of motion.  No back pain.    Physical Exam  BP 128/64 (BP Location: Left Arm, Cuff Size: Normal)   Pulse 83   Ht 5' (1.524 m)   Wt 148 lb (67.1 kg)   SpO2 94%   BMI 28.90 kg/m   GEN: A/Ox3; pleasant , NAD elderly    HEENT:  [place]/AT,  EACs-clear, TMs-wnl, NOSE-clear drainage , THROAT-clear, no lesions, no postnasal drip or exudate noted.   NECK:  Supple w/ fair ROM; no JVD; normal carotid impulses w/o bruits; no thyromegaly or nodules palpated; no lymphadenopathy.    RESP  Clear  P & A; w/o, wheezes/ rales/ or rhonchi. no accessory muscle use, no dullness to percussion  CARD:  RRR, no m/r/g, no peripheral edema, pulses intact, no cyanosis or clubbing.  GI:   Soft & nt; nml bowel sounds; no organomegaly or masses detected.   Musco: Warm bil, no deformities or joint swelling noted.   Neuro: alert, no focal deficits noted.    Skin: Warm, no lesions or rashes    Lab Results:  CBC No results found for: WBC, RBC, HGB, HCT, PLT, MCV, MCH, MCHC, RDW, LYMPHSABS, MONOABS, EOSABS, BASOSABS   BNP No results found for: BNP  ProBNP No results found for: PROBNP  Imaging: No results found.   Assessment & Plan:   COPD GOLD I  Controlled on current regimen  Smoking cessation   Plan  Patient Instructions  Check with PCP if you have had Prevnar vaccine.  Continue on Spiriva daily  Work on not smoking .  Follow up with Dr. Delton Coombes  In 4-6 months and As needed       Allergic rhinitis Continue on current regimen      Rubye Oaks,  NP 05/06/2017

## 2017-05-06 NOTE — Assessment & Plan Note (Signed)
Continue on current regimen .   

## 2017-05-06 NOTE — Assessment & Plan Note (Signed)
Controlled on current regimen  Smoking cessation   Plan  Patient Instructions  Check with PCP if you have had Prevnar vaccine.  Continue on Spiriva daily  Work on not smoking .  Follow up with Dr. Delton CoombesByrum  In 4-6 months and As needed

## 2017-05-06 NOTE — Patient Instructions (Signed)
Check with PCP if you have had Prevnar vaccine.  Continue on Spiriva daily  Work on not smoking .  Follow up with Dr. Delton CoombesByrum  In 4-6 months and As needed

## 2017-08-02 ENCOUNTER — Telehealth: Payer: Self-pay | Admitting: Emergency Medicine

## 2017-08-02 MED ORDER — DOXYCYCLINE HYCLATE 100 MG PO TABS: 100.0000 mg | ORAL_TABLET | Freq: Two times a day (BID) | ORAL | 0 refills | Status: DC

## 2017-08-02 NOTE — Telephone Encounter (Signed)
Called and spoke to pt. Pt c/o sinus congestion, headache, stuffy ears - difficult to hear, fatigue, prod cough with yellow mucus, blowing out yellow mucus from sinuses, and chest tightness  - s/s slowly worsening over the last 2 weeks. Pt denies an increase in SOB and f/c/s. Pt states she is taking Sudafed, Mucinex, Floanse, and Nyquil - with little relief.   Dr. Delton CoombesByrum please advise. Thanks.

## 2017-08-02 NOTE — Telephone Encounter (Signed)
Called and spoke to pt. Informed her of the recs per RB. Rx sent to preferred pharmacy. Pt verbalized understanding and denied any further questions or concerns at this time.  ? ?

## 2017-08-02 NOTE — Telephone Encounter (Signed)
Please ask her to take doxycycline 100 mg bid x 7 days.  She needs to call us, be seen at [facility] if not improving this week

## 2017-08-06 ENCOUNTER — Telehealth: Payer: Self-pay | Admitting: Emergency Medicine

## 2017-08-06 NOTE — Telephone Encounter (Signed)
Will route to Lung Nodule Pool to be followed up next week

## 2017-08-10 DIAGNOSIS — H26491 Other secondary cataract, right eye: Secondary | ICD-10-CM | POA: Diagnosis not present

## 2017-08-10 DIAGNOSIS — H11152 Pinguecula, left eye: Secondary | ICD-10-CM | POA: Diagnosis not present

## 2017-08-10 DIAGNOSIS — Z961 Presence of intraocular lens: Secondary | ICD-10-CM | POA: Diagnosis not present

## 2017-08-10 DIAGNOSIS — H524 Presbyopia: Secondary | ICD-10-CM | POA: Diagnosis not present

## 2017-08-10 NOTE — Telephone Encounter (Signed)
Spoke with Synetta Fail [facility]) , she will  Contact pt to schedule f/u LDCT.

## 2017-08-17 ENCOUNTER — Telehealth: Payer: Self-pay | Admitting: Emergency Medicine

## 2017-08-18 NOTE — Telephone Encounter (Signed)
Will route to lung nodule pool. 

## 2017-08-19 NOTE — Telephone Encounter (Signed)
Synetta FailAnita scheduled pt for LDCT 08/24/17.  Nothing further needed.

## 2017-08-24 ENCOUNTER — Inpatient Hospital Stay: Admission: RE | Admit: 2017-08-24 | Payer: Medicare Other | Source: Ambulatory Visit

## 2017-08-31 ENCOUNTER — Other Ambulatory Visit: Payer: Self-pay | Admitting: Emergency Medicine

## 2017-09-03 ENCOUNTER — Inpatient Hospital Stay: Admission: RE | Admit: 2017-09-03 | Payer: Medicare Other | Source: Ambulatory Visit

## 2017-09-10 ENCOUNTER — Ambulatory Visit (INDEPENDENT_AMBULATORY_CARE_PROVIDER_SITE_OTHER)
Admission: RE | Admit: 2017-09-10 | Discharge: 2017-09-10 | Disposition: A | Discharge status: Home / Self Care | Payer: Medicare Other | Source: Ambulatory Visit | Attending: Acute Care | Admitting: Acute Care

## 2017-09-10 DIAGNOSIS — F17210 Nicotine dependence, cigarettes, uncomplicated: Secondary | ICD-10-CM | POA: Diagnosis not present

## 2017-09-10 DIAGNOSIS — Z87891 Personal history of nicotine dependence: Secondary | ICD-10-CM | POA: Diagnosis not present

## 2017-09-14 ENCOUNTER — Encounter: Payer: Self-pay | Admitting: Emergency Medicine

## 2017-09-14 ENCOUNTER — Ambulatory Visit (INDEPENDENT_AMBULATORY_CARE_PROVIDER_SITE_OTHER): Payer: Medicare Other | Admitting: Emergency Medicine

## 2017-09-14 DIAGNOSIS — Z23 Encounter for immunization: Secondary | ICD-10-CM | POA: Diagnosis not present

## 2017-09-14 DIAGNOSIS — F17210 Nicotine dependence, cigarettes, uncomplicated: Secondary | ICD-10-CM

## 2017-09-14 DIAGNOSIS — R918 Other nonspecific abnormal finding of lung field: Secondary | ICD-10-CM | POA: Diagnosis not present

## 2017-09-14 MED ORDER — TIOTROPIUM BROMIDE MONOHYDRATE 2.5 MCG/ACT IN AERS: 2.0000 | INHALATION_SPRAY | Freq: Every day | RESPIRATORY_TRACT | 0 refills | Status: AC

## 2017-09-14 NOTE — Progress Notes (Signed)
Subjective:    Patient ID: Amber Holt, female    DOB: 1941-07-09, 75y.o.   MRN: [id number]  HPI 76 yo smoker (35 pk-yrs), frequent bronchitis / PNA, allergies. Continues to smoke   ROV 07/29/16 -- pt with a hx of COPD, pulmonary nodules that were stable on serial scans, now part of the LDCT screening program, Most recent scan was 07/2015. She will be due for repeat scan this month based on a RADS category 2 scan.  She was treated for an AE w pred and levaquin. On spiriva qd and albuterol prn > about 1-2x a day. Has trouble with hair spray exposure, pollution.  She is due for her LDCT on 08/21/16. Her exercise tolerance is good. She does have wheeze when she goes to work, is exposed to spray at work. She believes that she had prevnar w her PCP  ROV 09/14/17 -- Patient has a history of mild obstruction, COPD. Also stable pulmonary nodules. She began doing low-dose CT scan screening in August 2016. Most recent scan was 9/21 that I personally reviewed. Showed moderate centrilobular emphysema, diffuse bronchial wall thickening, no masses, stable lung nodules without any new nodules present. RADS 2 scan, needs follow-up in 12 months. She remains on Spiriva. She remains on singulair and flonase. She is off loratadine currently. she is planning to move to [place] to retire. She feels that she is slowing down some, less strength. Unclear that it is breathing that is limiting her. She has not needed albuterol lately.  Needs the flu shot.                                     PULMONARY FUNCTON TEST 03/08/2013  FVC 2.35  FEV1 1.59  FEV1/FVC 67.7  FVC  % Predicted 98  FEV % Predicted 95  FeF 25-75 .84  FeF 25-75 % Predicted 2.08      Objective:   Vitals:   09/14/17 1036 09/14/17 1037  BP:  128/62  Pulse:  88  SpO2:  94%  Weight: 147 lb (66.7 kg)   Height: 5' (1.524 m)     Gen: Pleasant, well-nourished, in no distress,  normal affect  ENT: No lesions,  mouth clear,  oropharynx clear, no postnasal  drip  Neck: No JVD, no TMG, no carotid bruits  Lungs: No use of accessory muscles, no dullness to percussion, clear without rales or rhonchi  Cardiovascular: RRR, heart sounds normal, no murmur or gallops, no peripheral edema  Musculoskeletal: No deformities, no cyanosis or clubbing  Neuro: alert, non focal  Skin: Warm, no lesions or rashes   CT scan 10/11/13 --  COMPARISON: 01/11/2013  FINDINGS:  Aortic and coronary artery atherosclerotic vascular disease noted.  Hepatic steatosis is present.  No pathologic thoracic adenopathy observed. The proximal right  subclavian artery aneurysm, 1.3 cm.  Benign 4 mm subpleural lymph nodes in the left upper lobe along the  major fissure, image 9 of series 3, no change from 2012. Additional  faint nodularity in the left upper lobe is unchanged from 2012 and  considered benign.  3 mm thick pleural-based nodule in the  Scattered small pulmonary nodules in both lungs measure 5 mm or  below, are stable compared through 09/25/2011, and are accordingly  considered benign.  Considerable emphysema noted with scattered fibrosis in the lungs.  IMPRESSION:  1. The small pulmonary nodules in both lungs are stable through 2  years ago and accordingly are considered benign. No further imaging  workup of these nodules is necessary based on current guidelines.  This recommendation follows the consensus statement: Guidelines for  Management of Small Pulmonary Nodules Detected on CT Scans: A  Statement from the Fleischner Society as published in Radiology  2005; 237:395-400.  2. 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  3. Atherosclerosis. 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  4. Hepatic steatosis.       Assessment & Plan:  Cigarette smoker Discussed cessation.  Pulmonary nodules Stable by repeat CT scan. She is now part of the LD CT screening program. She is due for a repeat scan in 08/2018.    Allergic rhinitis Continue Singulair, fluticasone nasal spray. She is not currently using/requiring loratadine. Spring appears to be her most active times of year  COPD GOLD I  Single flare in the last year.  Continue Spiriva once daily. Albuterol as needed. Flu shot today.   Levy Pupa, MD, PhD 09/14/2017, 11:03 AM [facility] [phone] or if no answer [phone]

## 2017-09-14 NOTE — Assessment & Plan Note (Signed)
Stable by repeat CT scan. She is now part of the LD CT screening program. She is due for a repeat scan in 08/2018.

## 2017-09-14 NOTE — Assessment & Plan Note (Signed)
Single flare in the last year.  Continue Spiriva once daily. Albuterol as needed. Flu shot today.

## 2017-09-14 NOTE — Assessment & Plan Note (Signed)
Discussed cessation 

## 2017-09-14 NOTE — Addendum Note (Signed)
Addended by: Wyvonne Lenz on: 09/14/2017 11:11 AM   Modules accepted: Orders

## 2017-09-14 NOTE — Patient Instructions (Signed)
Please continue Spiriva once daily Continue Singulair, fluticasone nasal spray as you are taking them Keep albuterol available to use 2 puffs if needed for shortness of breath You are low-dose CT scan lung cancer screening test shows no changes compared with last year. This is good news. You are due for a repeat scan in September 2019. Please have your new pulmonary office in [place] request records from our office to that we can forward all of your notes, testing, scans.

## 2017-09-14 NOTE — Assessment & Plan Note (Signed)
Continue Singulair, fluticasone nasal spray. She is not currently using/requiring loratadine. Spring appears to be her most active times of year

## 2017-09-16 ENCOUNTER — Other Ambulatory Visit: Payer: Self-pay | Admitting: Acute Care

## 2017-09-16 DIAGNOSIS — F17210 Nicotine dependence, cigarettes, uncomplicated: Principal | ICD-10-CM

## 2017-09-16 DIAGNOSIS — Z122 Encounter for screening for malignant neoplasm of respiratory organs: Secondary | ICD-10-CM

## 2017-09-28 ENCOUNTER — Telehealth: Payer: Self-pay | Admitting: Emergency Medicine

## 2017-09-28 MED ORDER — AZITHROMYCIN 250 MG PO TABS: ORAL_TABLET | ORAL | 0 refills | Status: AC

## 2017-09-28 NOTE — Telephone Encounter (Signed)
Zpack #1 take as directed, w/ food.  Mucinex DM Twice daily As needed  Cough / congestion  Ov if not better.  Please contact office for sooner follow up if symptoms do not improve or worsen or seek emergency care

## 2017-09-28 NOTE — Telephone Encounter (Signed)
Pt is aware of TP's recommendations and voiced her understanding. Rx has been sent to preferred pharmacy. Nothing further needed.

## 2017-09-28 NOTE — Telephone Encounter (Signed)
Pt c/o prod cough with yellow mucus X5 days.  Denies fever, sob, chest pain, sinus congestion, PND.  Pt has been taking mucinex, singulair, and claritin.  Requesting further recs.    [facility] uses [facility].    Sending to TP as RB is unavailable.  TP please advise.  Thanks.

## 2017-10-06 DIAGNOSIS — J449 Chronic obstructive pulmonary disease, unspecified: Secondary | ICD-10-CM | POA: Diagnosis not present

## 2017-10-06 DIAGNOSIS — E119 Type 2 diabetes mellitus without complications: Secondary | ICD-10-CM | POA: Diagnosis not present

## 2017-10-06 DIAGNOSIS — E039 Hypothyroidism, unspecified: Secondary | ICD-10-CM | POA: Diagnosis not present

## 2017-10-06 DIAGNOSIS — I10 Essential (primary) hypertension: Secondary | ICD-10-CM | POA: Diagnosis not present

## 2017-10-06 DIAGNOSIS — R5382 Chronic fatigue, unspecified: Secondary | ICD-10-CM | POA: Diagnosis not present

## 2017-12-30 ENCOUNTER — Telehealth: Payer: Self-pay | Admitting: Emergency Medicine

## 2017-12-30 MED ORDER — TIOTROPIUM BROMIDE MONOHYDRATE 18 MCG IN CAPS: 18.0000 ug | ORAL_CAPSULE | Freq: Every day | RESPIRATORY_TRACT | 2 refills | Status: AC

## 2017-12-30 NOTE — Telephone Encounter (Signed)
Called and spoke with pt. Pt states [place]. Pt states she has upcoming apt with pulmonologist  in March.  Pt is requesting Rx for Sprivia capsule to be sent to [facility]. Rx for Spiriva has been sent to preferred pharmacy to get pt by until apt.  Nothing further is needed.

## 2018-01-28 ENCOUNTER — Telehealth: Payer: Self-pay | Admitting: Emergency Medicine

## 2018-01-28 NOTE — Telephone Encounter (Signed)
[facility] from [facility]. Initiated PA via [email or web address] Key: [id number]  Sent to plan, a determination is expected within 1-5 business days. Routing to [facility] for follow-up.

## 2018-02-01 NOTE — Telephone Encounter (Signed)
Went on covermymeds to see if we had a response on coverage of med and it is still saying that we are waiting for determination.

## 2018-02-01 NOTE — Telephone Encounter (Signed)
Received a fax form pt's insurance. Spiriva Handihaler has been approved. Nothing further is needed at this time.

## 2018-04-10 IMAGING — DX DG CHEST 2V
2 series · 2 of 2 positions shown · non-contrast
Comparison: 08/21/2016

CLINICAL DATA: COPD, cough and congestion, flu diagnosed 5 days ago

EXAM:
CHEST  2 VIEW

[chest pa]
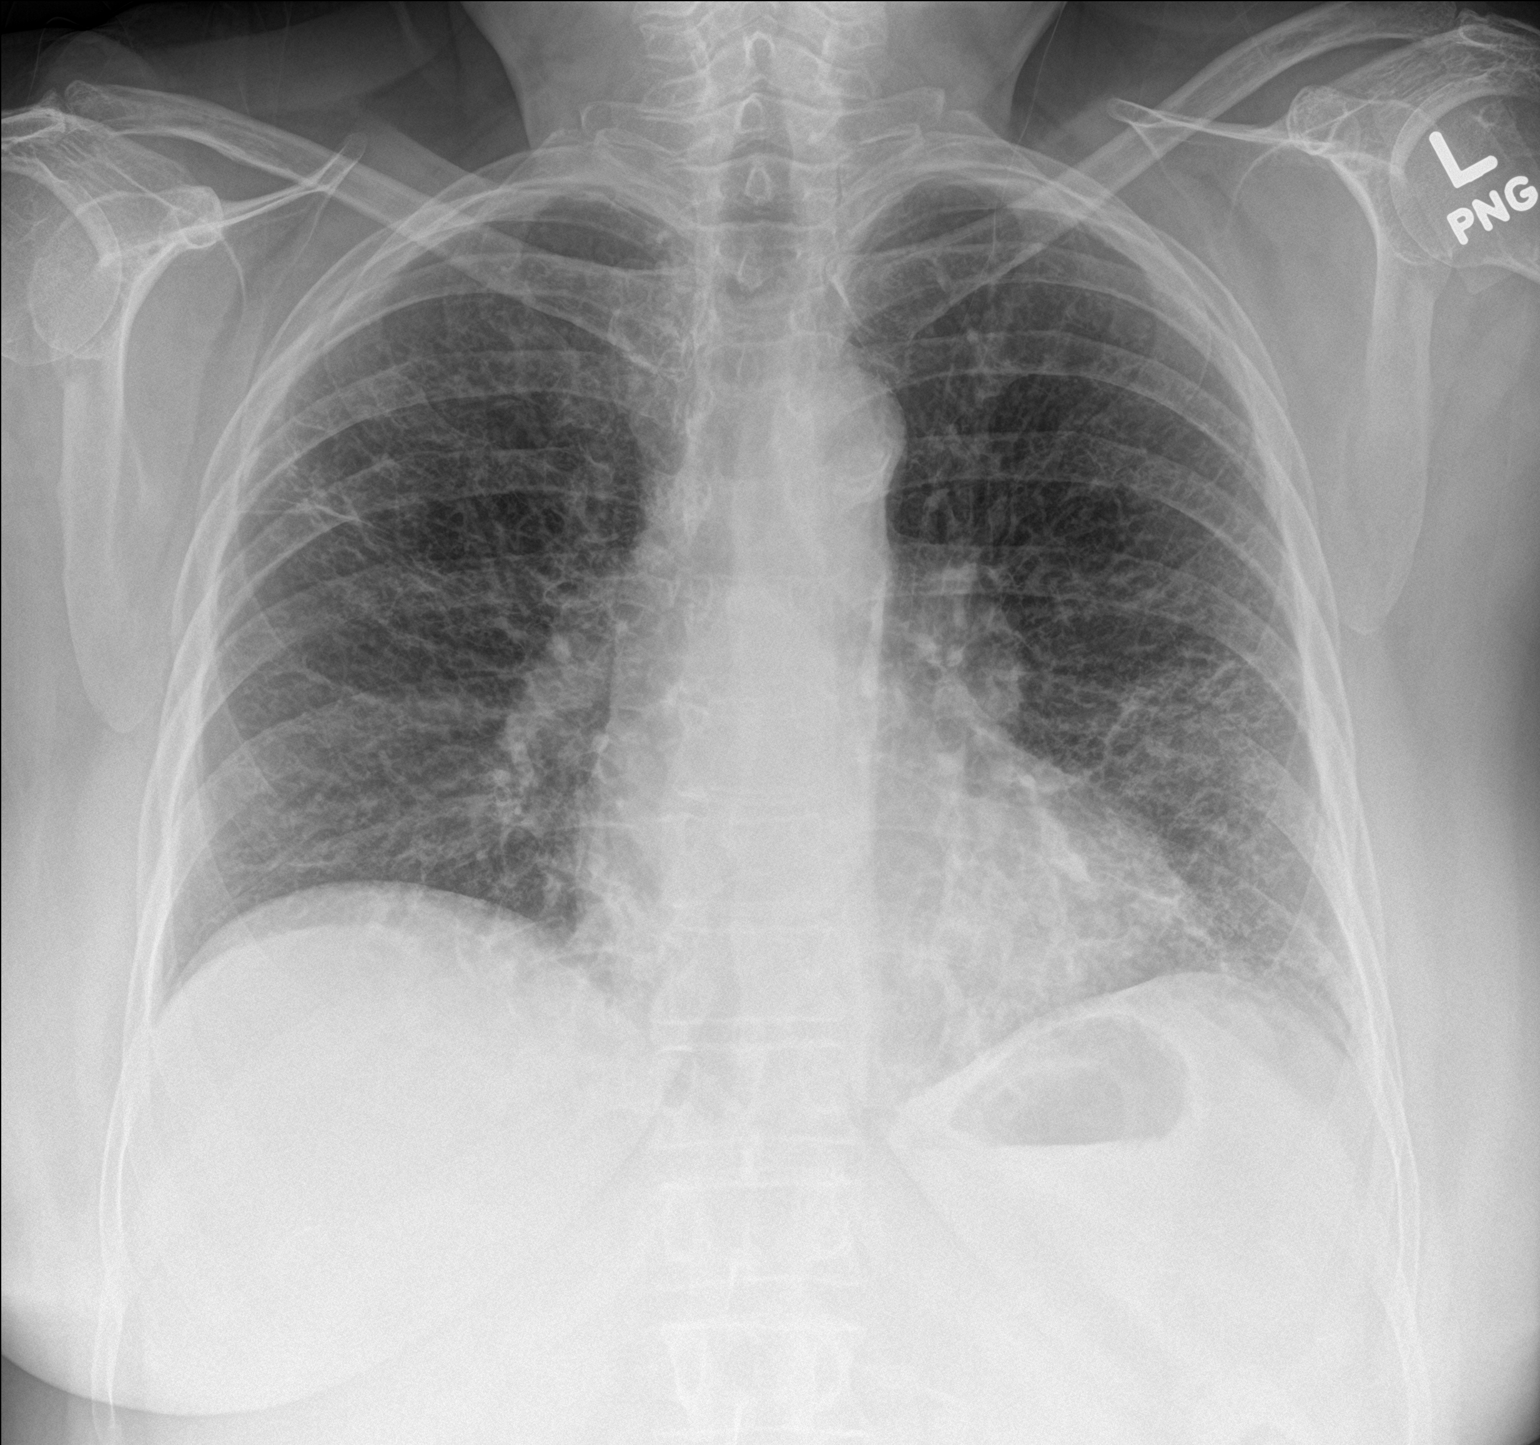

[chest lat]
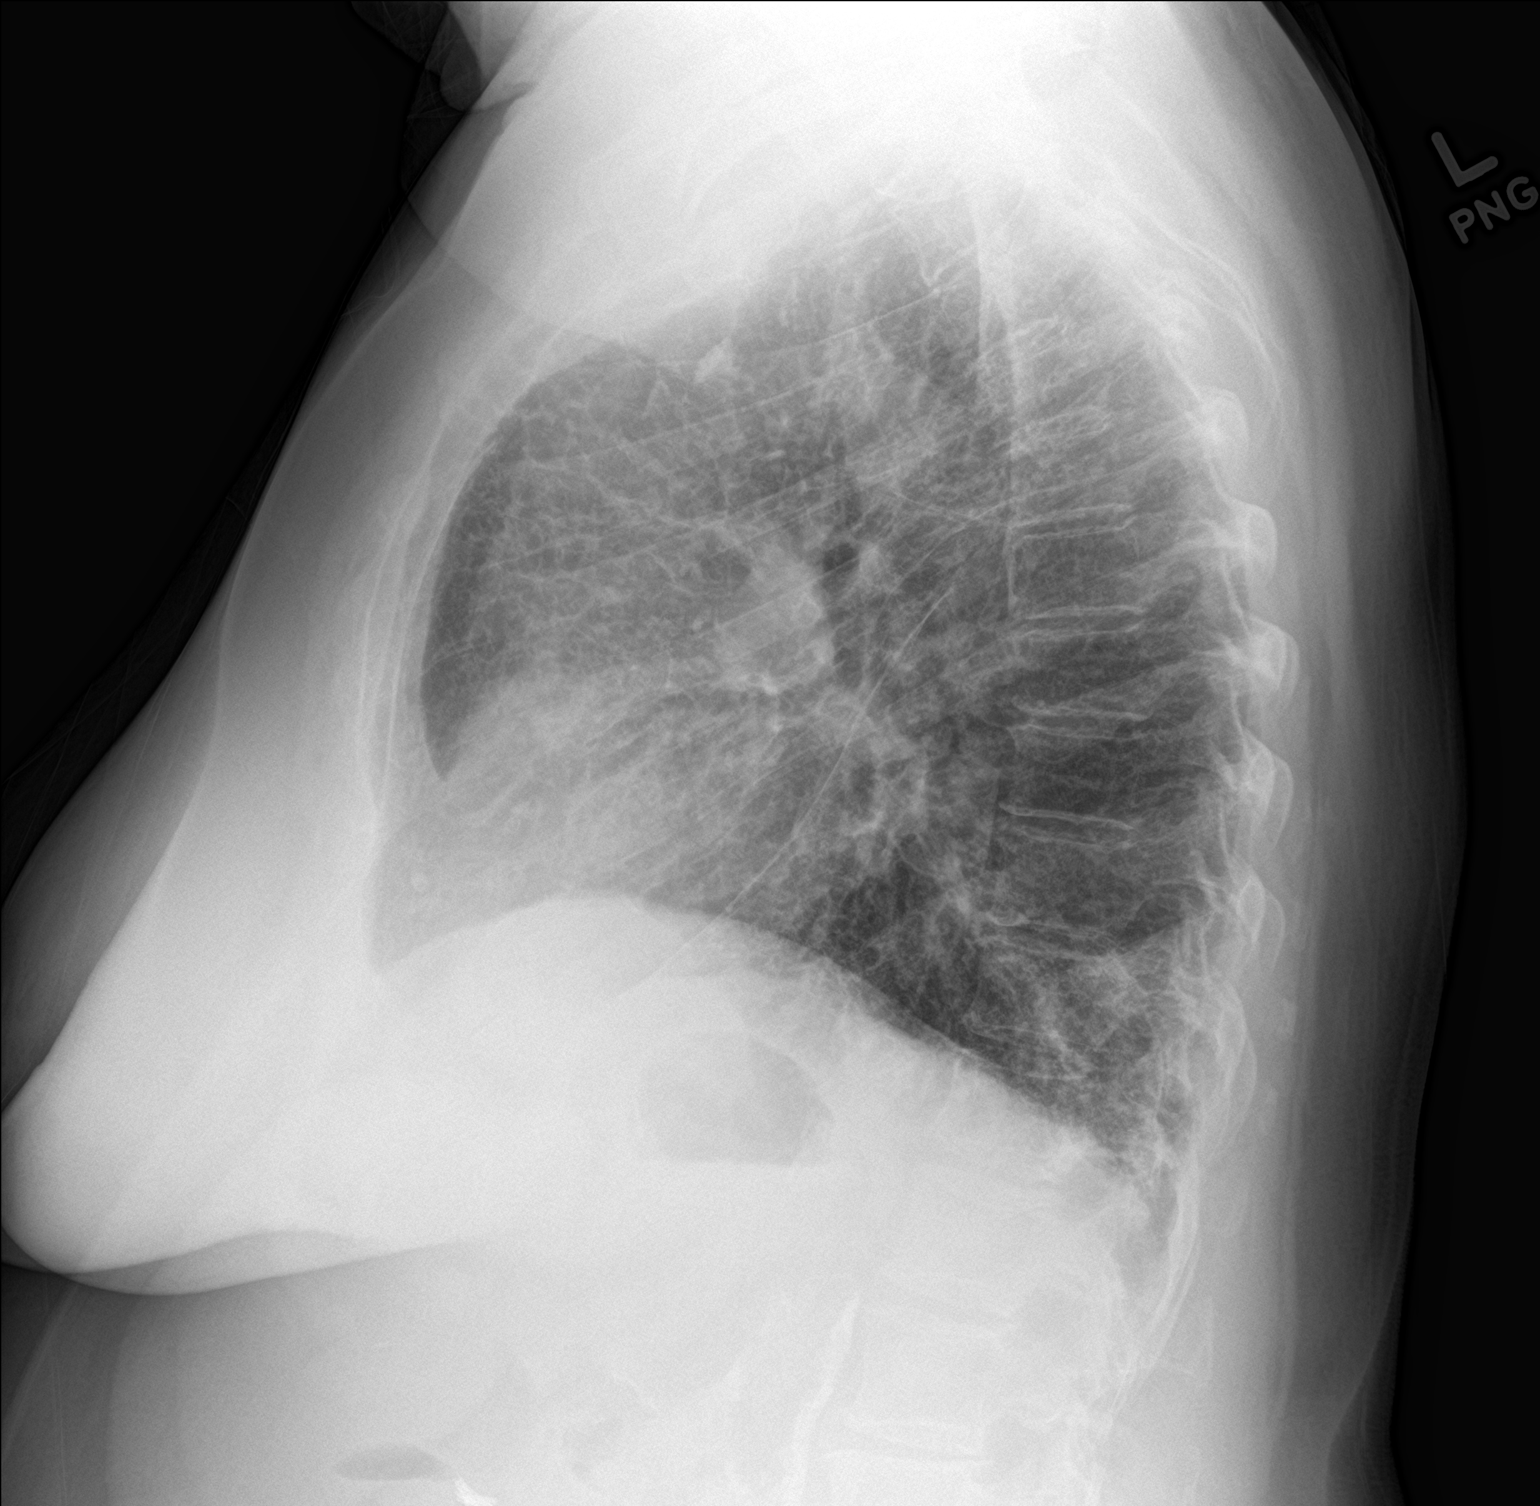

[2 of 2 positions shown; findings below may reference images not displayed]

FINDINGS: Cardiomediastinal silhouette is stable. Chronic reticular
interstitial prominence bilaterally again noted. Stable scarring in
right upper lobe. Bronchiectasis bilateral lower lobe again noted.
Stable peripheral fibrotic changes bilaterally. No definite
superimposed infiltrate or pulmonary edema.
IMPRESSION: Stable scarring in right upper lobe. Bronchiectasis bilateral lower
lobe again noted. Stable peripheral fibrotic changes bilaterally. No
definite superimposed infiltrate or pulmonary edema.

## 2023-04-21 DEATH — deceased
